# Patient Record
Sex: Female | Born: 1990 | Race: White | Hispanic: No | Marital: Married | Smoking: Never smoker
Health system: Southern US, Community
[De-identification: ages and names within clinical notes are randomized; demographics above are authoritative.]

## PROBLEM LIST (undated history)

## (undated) ENCOUNTER — Inpatient Hospital Stay (HOSPITAL_COMMUNITY): Payer: Self-pay

## (undated) DIAGNOSIS — I498 Other specified cardiac arrhythmias: Secondary | ICD-10-CM

## (undated) DIAGNOSIS — G90A Postural orthostatic tachycardia syndrome (POTS): Secondary | ICD-10-CM

## (undated) DIAGNOSIS — G43909 Migraine, unspecified, not intractable, without status migrainosus: Secondary | ICD-10-CM

## (undated) DIAGNOSIS — D473 Essential (hemorrhagic) thrombocythemia: Secondary | ICD-10-CM

## (undated) HISTORY — DX: Essential (hemorrhagic) thrombocythemia: D47.3

## (undated) HISTORY — PX: OTHER SURGICAL HISTORY: SHX169

## (undated) HISTORY — DX: Postural orthostatic tachycardia syndrome (POTS): G90.A

## (undated) HISTORY — DX: Migraine, unspecified, not intractable, without status migrainosus: G43.909

## (undated) HISTORY — DX: Other specified cardiac arrhythmias: I49.8

---

## 2009-11-04 ENCOUNTER — Other Ambulatory Visit: Admission: RE | Admit: 2009-11-04 | Discharge: 2009-11-04 | Payer: Self-pay | Admitting: Gynecology

## 2009-11-04 ENCOUNTER — Ambulatory Visit: Payer: Self-pay | Admitting: Women's Health

## 2009-12-23 ENCOUNTER — Ambulatory Visit: Payer: Self-pay | Admitting: Women's Health

## 2011-03-29 ENCOUNTER — Encounter: Payer: Self-pay | Admitting: Women's Health

## 2011-03-29 ENCOUNTER — Ambulatory Visit (INDEPENDENT_AMBULATORY_CARE_PROVIDER_SITE_OTHER): Payer: BC Managed Care – PPO | Admitting: Women's Health

## 2011-03-29 ENCOUNTER — Other Ambulatory Visit (HOSPITAL_COMMUNITY)
Admission: RE | Admit: 2011-03-29 | Discharge: 2011-03-29 | Disposition: A | Discharge status: Home / Self Care | Payer: BC Managed Care – PPO | Source: Ambulatory Visit | Attending: Obstetrics and Gynecology | Admitting: Obstetrics and Gynecology

## 2011-03-29 VITALS — BP 110/70 | Ht 63.0 in | Wt 158.0 lb

## 2011-03-29 DIAGNOSIS — Z23 Encounter for immunization: Secondary | ICD-10-CM

## 2011-03-29 DIAGNOSIS — Z01419 Encounter for gynecological examination (general) (routine) without abnormal findings: Secondary | ICD-10-CM

## 2011-03-29 DIAGNOSIS — Z113 Encounter for screening for infections with a predominantly sexual mode of transmission: Secondary | ICD-10-CM

## 2011-03-29 LAB — CBC WITH DIFFERENTIAL/PLATELET
Eosinophils Relative: 1 % (ref 0–5)
HCT: 41.8 % (ref 36.0–46.0)
Hemoglobin: 13.6 g/dL (ref 12.0–15.0)
Lymphocytes Relative: 27 % (ref 12–46)
Lymphs Abs: 2 10*3/uL (ref 0.7–4.0)
MCV: 93.9 fL (ref 78.0–100.0)
Platelets: 485 10*3/uL — ABNORMAL HIGH (ref 150–400)
RBC: 4.45 MIL/uL (ref 3.87–5.11)
WBC: 7.1 10*3/uL (ref 4.0–10.5)

## 2011-03-29 LAB — HIV ANTIBODY (ROUTINE TESTING W REFLEX): HIV: NONREACTIVE

## 2011-03-29 LAB — HEPATITIS C ANTIBODY: HCV Ab: NEGATIVE

## 2011-03-29 NOTE — Patient Instructions (Signed)

## 2011-03-29 NOTE — Progress Notes (Signed)
Leslie Guerrero Feb 09, 1990 [id number]    History:    The patient presents for annual exam.  Monthly 4-5 day cycle on Ortho Tri-Cyclen without complaint. History of one normal Pap smear. Had1 gardasil in 2011, did not complete series. New partner. Requests  nexplanon.   Past medical history, past surgical history, family history and social history were all reviewed and documented in the EPIC chart. 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   ROS:  A  ROS was performed and pertinent positives and negatives are included in the history.  Exam:  Filed Vitals:   03/29/11 0806  BP: 110/70    General appearance:  Normal Head/Neck:  Normal, without cervical or supraclavicular adenopathy. Thyroid:  Symmetrical, normal in size, without palpable masses or nodularity. Respiratory  Effort:  Normal  Auscultation:  Clear without wheezing or rhonchi Cardiovascular  Auscultation:  Regular rate, without rubs, murmurs or gallops  Edema/varicosities:  Not grossly evident Abdominal  Soft,nontender, without masses, guarding or rebound.  Liver/spleen:  No organomegaly noted  Hernia:  None appreciated  Skin  Inspection:  Grossly normal  Palpation:  Grossly normal Neurologic/psychiatric  Orientation:  Normal with appropriate conversation.  Mood/affect:  Normal  Genitourinary    Breasts: Examined lying and sitting.     Right: Without masses, retractions, discharge or axillary adenopathy.     Left: Without masses, retractions, discharge or axillary adenopathy.   Inguinal/mons:  Normal without inguinal adenopathy  External genitalia:  Normal  BUS/Urethra/Skene's glands:  Normal  Bladder:  Normal  Vagina:  Normal  Cervix:  Normal  Uterus:   normal in size, shape and contour.  Midline and mobile  Adnexa/parametria:     Rt: Without masses or tenderness.   Lt: Without masses or tenderness.  Anus and perineum: Normal  Digital rectal exam:   Assessment/Plan:  21 y.o. SWF G0 for annual exam without  complaint.  Normal GYN exam Restart gardasil series STD screen Contraceptive counseling  Plan:Nexplanon information given and reviewed. Reviewed slight risk for spotting. Encouraged condoms until and first month on nexplanon and for infection control. Used depo provera in high school and did well on. Will schedule with her cycle with Dr.Fontaine. SBE's, exercise, calcium rich diet, MVI daily encouraged. CBC, UA, Pap, GC/Chlamydia, HIV, hepatitis C, RPR. Gardasil series restarted, reviewed importance of returning in 2 and 6 months to complete series.    Harrington Challenger [facility], 8:50 AM 03/29/2011

## 2011-03-30 ENCOUNTER — Other Ambulatory Visit: Payer: Self-pay | Admitting: *Deleted

## 2011-03-30 ENCOUNTER — Telehealth: Payer: Self-pay | Admitting: *Deleted

## 2011-03-30 DIAGNOSIS — Z3049 Encounter for surveillance of other contraceptives: Secondary | ICD-10-CM

## 2011-03-30 LAB — URINALYSIS W MICROSCOPIC + REFLEX CULTURE
Hgb urine dipstick: NEGATIVE
Leukocytes, UA: NEGATIVE
Nitrite: NEGATIVE
Protein, ur: NEGATIVE mg/dL
Urobilinogen, UA: 0.2 mg/dL (ref 0.0–1.0)

## 2011-03-30 MED ORDER — ETONOGESTREL 68 MG ~~LOC~~ IMPL: 68.0000 mg | DRUG_IMPLANT | Freq: Once | SUBCUTANEOUS | Status: DC

## 2011-03-30 NOTE — Telephone Encounter (Signed)
Patient informed benefits for Nexplanon are $35 copay. TF to insert on period.  Transferred to appts to set up.

## 2011-03-30 NOTE — Telephone Encounter (Signed)
Message copied by Libby Maw on Thu Mar 30, 2011 10:41 AM ------      Message from: [place], [place] J      Created: Wed Mar 29, 2011  9:30 AM       Freedom Peddy, please check nexplanon benefits. Dr. Audie Box to place.             Also disregard checking On benefits for [employer].

## 2011-04-05 ENCOUNTER — Other Ambulatory Visit: Payer: Self-pay

## 2011-04-05 DIAGNOSIS — R7989 Other specified abnormal findings of blood chemistry: Secondary | ICD-10-CM

## 2011-04-19 ENCOUNTER — Ambulatory Visit (INDEPENDENT_AMBULATORY_CARE_PROVIDER_SITE_OTHER): Payer: BC Managed Care – PPO | Admitting: Gynecology

## 2011-04-19 ENCOUNTER — Encounter: Payer: Self-pay | Admitting: Gynecology

## 2011-04-19 ENCOUNTER — Other Ambulatory Visit: Payer: Self-pay | Admitting: Gynecology

## 2011-04-19 DIAGNOSIS — Z3049 Encounter for surveillance of other contraceptives: Secondary | ICD-10-CM

## 2011-04-19 DIAGNOSIS — Z30017 Encounter for initial prescription of implantable subdermal contraceptive: Secondary | ICD-10-CM

## 2011-04-19 DIAGNOSIS — R52 Pain, unspecified: Secondary | ICD-10-CM

## 2011-04-19 MED ORDER — LIDOCAINE HCL 1 % IJ SOLN: 3.0000 mL | Freq: Once | INTRAMUSCULAR | Status: DC

## 2011-04-19 NOTE — Patient Instructions (Signed)
Take pressure dressing off tomorrow. Report any redness swelling or drainage from the insertion site.  Follow up if any issues or questions. Otherwise follow up routinely.

## 2011-04-19 NOTE — Progress Notes (Signed)
Patient presents for and Nexplanon placement. Normally sees [place] and was counseled for this. Had used Depo-Provera in the past. Had switched over to oral contraceptives but likes the convenience of a long acting contraceptive. She is currently on a normal menses. I reviewed Nexplanon with her to include the insertional process. I reviewed the risks to include infection, bleeding with hemorrhage, neurovascular injury leading to permanent sequela I and the need for future surgery, migration requiring more involved removal surgery and the risk of failure. Side effect profile reviewed to include prolonged or irregular bleeding. She understands and accepts all of this and wants to proceed with placement.  She has read and signed the consent form.  Patient is right-handed.  Procedure with Sherrilyn Rist assistant Left arm was positioned and the insertional route was measured and marked per manufacturer/training guidelines. The skin was cleansed with Betadine and the insertional tract was injected subcutaneously with 1% lidocaine. The Nexplanon was then placed according to manufacturer's guidelines without difficulty. The Nexplanon was clearly palpable under the skin and I had the patient feel it herself. A Steri-Strip was placed over the insertional site and a pressure dressing applied. Postoperative instructions were given to the patient.  Lot #[id number]

## 2011-05-24 DIAGNOSIS — D75839 Thrombocytosis, unspecified: Secondary | ICD-10-CM

## 2011-05-24 HISTORY — DX: Thrombocytosis, unspecified: D75.839

## 2011-06-13 ENCOUNTER — Other Ambulatory Visit: Payer: BC Managed Care – PPO

## 2011-06-13 ENCOUNTER — Ambulatory Visit (INDEPENDENT_AMBULATORY_CARE_PROVIDER_SITE_OTHER): Payer: BC Managed Care – PPO

## 2011-06-13 DIAGNOSIS — Z23 Encounter for immunization: Secondary | ICD-10-CM

## 2011-06-13 LAB — CBC WITH DIFFERENTIAL/PLATELET
Eosinophils Relative: 1 % (ref 0–5)
HCT: 39.3 % (ref 36.0–46.0)
Hemoglobin: 13.4 g/dL (ref 12.0–15.0)
Lymphocytes Relative: 27 % (ref 12–46)
MCHC: 34.1 g/dL (ref 30.0–36.0)
MCV: 89.9 fL (ref 78.0–100.0)
Monocytes Absolute: 0.6 10*3/uL (ref 0.1–1.0)
Monocytes Relative: 9 % (ref 3–12)
Neutro Abs: 4.6 10*3/uL (ref 1.7–7.7)

## 2011-06-20 ENCOUNTER — Encounter: Payer: Self-pay | Admitting: Gynecology

## 2011-06-20 ENCOUNTER — Telehealth: Payer: Self-pay | Admitting: Gynecology

## 2011-06-20 DIAGNOSIS — D473 Essential (hemorrhagic) thrombocythemia: Secondary | ICD-10-CM

## 2011-06-20 NOTE — Telephone Encounter (Signed)
Tell patient that her platelet count is mildly elevated. It has been over the last several checks. I would like her to come in and have several other blood tests just to check other possibilities. I may ultimately want her to see a hematologist as a baseline. I do not think that this is anything of significance but I think it is worth evaluating at least once as I think that she will continue to have a mildly elevated platelet count probably throughout her life. Orders for the blood tests were placed.

## 2011-06-21 ENCOUNTER — Telehealth: Payer: Self-pay | Admitting: Gynecology

## 2011-06-21 NOTE — Telephone Encounter (Signed)
Pt informed with the below note. 

## 2011-06-21 NOTE — Telephone Encounter (Signed)
Erroneous entry

## 2011-06-27 ENCOUNTER — Other Ambulatory Visit: Payer: BC Managed Care – PPO

## 2011-06-27 DIAGNOSIS — D473 Essential (hemorrhagic) thrombocythemia: Secondary | ICD-10-CM

## 2011-06-28 LAB — C-REACTIVE PROTEIN: CRP: 0.28 mg/dL (ref <0.60)

## 2011-06-28 LAB — SEDIMENTATION RATE: Sed Rate: 1 mm/hr (ref 0–22)

## 2011-06-30 ENCOUNTER — Telehealth: Payer: Self-pay | Admitting: Oncology

## 2011-06-30 NOTE — Telephone Encounter (Signed)
called pt to schedule appt and pt stated that she will see what day she can have off and rtn call to scheduled

## 2011-07-04 ENCOUNTER — Telehealth: Payer: Self-pay | Admitting: Oncology

## 2011-07-04 NOTE — Telephone Encounter (Signed)
called pt lmovm to rtn call to schedule appt 

## 2011-07-04 NOTE — Telephone Encounter (Signed)
pt called and scheduled appt for 07/09.  faxed over a letter to Dr. Audie Box with appt d/t

## 2011-07-05 ENCOUNTER — Telehealth: Payer: Self-pay | Admitting: Oncology

## 2011-07-05 NOTE — Telephone Encounter (Signed)
Referred by Dr. Colin Broach Dx- Thrombcytosis

## 2011-08-01 ENCOUNTER — Telehealth: Payer: Self-pay | Admitting: General Practice

## 2011-08-01 ENCOUNTER — Encounter: Payer: Self-pay | Admitting: Oncology

## 2011-08-01 ENCOUNTER — Ambulatory Visit (HOSPITAL_BASED_OUTPATIENT_CLINIC_OR_DEPARTMENT_OTHER): Payer: BC Managed Care – PPO | Admitting: Oncology

## 2011-08-01 ENCOUNTER — Ambulatory Visit: Payer: BC Managed Care – PPO

## 2011-08-01 ENCOUNTER — Ambulatory Visit (HOSPITAL_BASED_OUTPATIENT_CLINIC_OR_DEPARTMENT_OTHER): Payer: No Typology Code available for payment source | Admitting: Lab

## 2011-08-01 VITALS — BP 118/76 | HR 116 | Temp 99.1°F | Ht 63.0 in | Wt 152.9 lb

## 2011-08-01 DIAGNOSIS — M349 Systemic sclerosis, unspecified: Secondary | ICD-10-CM

## 2011-08-01 DIAGNOSIS — D473 Essential (hemorrhagic) thrombocythemia: Secondary | ICD-10-CM

## 2011-08-01 DIAGNOSIS — D72829 Elevated white blood cell count, unspecified: Secondary | ICD-10-CM

## 2011-08-01 LAB — CBC WITH DIFFERENTIAL/PLATELET
BASO%: 0.3 % (ref 0.0–2.0)
EOS%: 0.8 % (ref 0.0–7.0)
LYMPH%: 20 % (ref 14.0–49.7)
MCH: 31.5 pg (ref 25.1–34.0)
MCHC: 33.9 g/dL (ref 31.5–36.0)
MONO#: 0.6 10*3/uL (ref 0.1–0.9)
MONO%: 6.3 % (ref 0.0–14.0)
Platelets: 439 10*3/uL — ABNORMAL HIGH (ref 145–400)
RBC: 4.2 10*6/uL (ref 3.70–5.45)
WBC: 8.9 10*3/uL (ref 3.9–10.3)

## 2011-08-01 LAB — CHCC SMEAR

## 2011-08-01 NOTE — Progress Notes (Signed)
Patient came in today as a new patient and she has one Musician.I did explain to her our financial assistance and co-pay assistance program we offer here,she said that she is [place] for now.I did give her my card just in case she had any questions she can call me.

## 2011-08-01 NOTE — Telephone Encounter (Signed)
appts made and printed for pt aom °

## 2011-08-01 NOTE — Progress Notes (Signed)
Leslie Guerrero New Patient Consult   Referring MD: Leslie Guerrero 21 y.o.  1990-06-04    Reason for Referral: Thrombocytosis     HPI: She reports seeing Dr. Audie Box for a routine exam in March of this year. A CBC was remarkable for a platelet count of 485,000. The hemoglobin returned at 13.6 with an MCV of 93.9. The white count returned at 7.1 with an absolute neutrophil count of 4.5. Additional laboratory studies included a negative hepatitis C antibody, negative HIV, and a negative urinalysis.  She underwent placement of an implanted birth control device on 04/19/2011. A repeat CBC on may 21st 2013 found the platelets of 531,000. On 06/27/2011 the ferritin returned at 46 with a C. reactive protein of 0.28.   She reports no previous history of thrombocytosis. She feels well.  Past Medical History  Diagnosis Date  . Thrombocytosis 05/2011    platelet count 531,000   .    G0 P0  .     Placement of a Nexplanon on 04/19/2011.  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 Breast cancer Paternal great Grandmother   . Diabetes Paternal Uncle   . Diabetes Paternal Grandfather   . Stroke Paternal Grandfather     No current outpatient prescriptions on file. Current facility-administered medications:etonogestrel (IMPLANON) implant 68 mg, 68 mg, Subcutaneous, Once, Leslie P Fontaine, MD;  lidocaine (XYLOCAINE) 1 % (with pres) injection 3 mL, 3 mL, Intradermal, Once, Leslie Lords, MD  Allergies:  Allergies  Allergen Reactions  . Nickel     Social History: She lives alone in [place]. 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 She does not use tobacco or alcohol. No transfusion history. She denies risk factors for HIV or hepatitis. No unusual exposures to chemicals or radiation.   ROS:   Positives include:  "Spotting "after placement of the birth control implant in March of 2013, no regular menstrual cycle, thickening of the skin at the right hand with a decreased size in the right hand/fingers compared to the left side  A complete ROS was otherwise negative.  Physical Exam:  Blood pressure 118/76, pulse 116, temperature 99.1 F (37.3 C), temperature source Oral, height 5\' 3"  (1.6 m), weight 152 lb 14.4 oz (69.355 kg).  HEENT: No thrush or ulcers, the tonsils appear symmetrically enlarged, oropharynx without mass, neck without mass Lungs: Clear bilaterally Cardiac: Regular rate and rhythm Abdomen: No hepatosplenomegaly, nontender  Vascular: No leg edema Lymph nodes: "Shotty "bilateral axillary nodes, no cervical, supraclavicular, or inguinal nodes Neurologic: Alert and oriented, the motor exam appears intact in the upper and lower Jevity, deep tendon reflexes are 2+ and symmetric at the arms and knees Skin: Multiple tattoos, slight hyperpigmentation of the right compared to the left hand Musculoskeletal: Joint without erythema or edema, the right hand and fingers appear underdeveloped compared to the left side   LAB:  CBC  Lab Results  Component Value Date   WBC 8.9 08/01/2011   HGB 13.2 08/01/2011   HCT 39.0 08/01/2011   MCV 92.9 08/01/2011   PLT 439* 08/01/2011    Blood smear: The platelets appear increased in number. There are a few large platelets. The majority of the white cells are mature neutrophils and lymphocytes. A few "atypical "lymphocytes with folded nuclei, no blasts or other young  forms are seen. A few ovalocytes and teardrops. The polychromasia is not increased.  Assessment/Plan:   1. Thrombocytosis 2. Status post placement of an implanted birth control device in March of 2013 3. Diagnosis of "scleroderma "involving the right hand as a child with under development of the right hand/fingers  4. a few atypical lymphocytes seen on review the peripheral blood smear 08/01/2011     Disposition:   She is referred for evaluation of thrombocytosis. There is mild thrombocytosis today. She does not appear to have iron deficiency. No clinical evidence of a systemic inflammatory condition to account for the thrombocytosis. A C. reactive protein was normal and HIV/hepatitis C serologies were negative. Dr. Reynold Bowen office.  She could have an underlying myeloproliferative disorder or the thrombocytosis may be related to a benign normal variant.  We check an LDH and JAK-2 mutation today.  She will return for an office visit and CBC in 3 months. We will proceed with additional diagnostic evaluation to include imaging of the spleen and a bone marrow biopsy if she develops progressive thrombocytosis or new hematologic abnormalities.  Leslie Guerrero 08/01/2011, 6:09 PM

## 2011-08-10 ENCOUNTER — Telehealth: Payer: Self-pay | Admitting: *Deleted

## 2011-08-10 NOTE — Telephone Encounter (Signed)
Message copied by Caleb Popp on Thu Aug 10, 2011 12:18 PM ------      Message from: Thornton Papas B      Created: Wed Aug 09, 2011  8:27 PM       Please call patient, labs negative for iron deficiency or myeloproliferative disorder, f/u as scheduled

## 2011-08-10 NOTE — Telephone Encounter (Signed)
Left message on voicemail for pt to call office for lab results. 

## 2011-10-25 ENCOUNTER — Telehealth: Payer: Self-pay | Admitting: Oncology

## 2011-10-25 NOTE — Telephone Encounter (Signed)
Returned pt's call re cancelling 10/9 appt. Per pt she will not be rescheduling due to she is moving out of town. Desk nurse aware.

## 2011-11-01 ENCOUNTER — Ambulatory Visit: Payer: No Typology Code available for payment source | Admitting: Nurse Practitioner

## 2011-11-01 ENCOUNTER — Other Ambulatory Visit: Payer: No Typology Code available for payment source | Admitting: Lab

## 2014-02-11 ENCOUNTER — Ambulatory Visit (INDEPENDENT_AMBULATORY_CARE_PROVIDER_SITE_OTHER): Payer: BC Managed Care – PPO | Admitting: Women's Health

## 2014-02-11 ENCOUNTER — Encounter: Payer: Self-pay | Admitting: Women's Health

## 2014-02-11 VITALS — BP 126/80 | Ht 63.0 in | Wt 178.0 lb

## 2014-02-11 DIAGNOSIS — Z30011 Encounter for initial prescription of contraceptive pills: Secondary | ICD-10-CM

## 2014-02-11 DIAGNOSIS — Z01419 Encounter for gynecological examination (general) (routine) without abnormal findings: Secondary | ICD-10-CM

## 2014-02-11 MED ORDER — NORETHIN ACE-ETH ESTRAD-FE 1-20 MG-MCG PO TABS: 1.0000 | ORAL_TABLET | Freq: Every day | ORAL | Status: DC

## 2014-02-11 NOTE — Progress Notes (Signed)
Leslie Guerrero [birth year] [id number]    History:    Presents for annual exam with 2 complaints, questions about contraception and periodic enlargement of the neck/below chin. Previously on Implanon, taken out by another office in 10/2013 due to dysmenorrhea and menses/spotting for 3 weeks out of the month for 3 continuous months. Other office gave trial of Minastrin, with relief of dysmenorrhea, shorter flow. Normal pap history. Gardasil completed. Same partner. Sporadic enlargement of sublingual gland area, slight tenderness. Denies fever, dry mouth. No temporal association.  Past medical history, past surgical history, family history and social history were all reviewed and documented in the EPIC chart. New Engineer, manufacturing systems. Breast cancer PGM. Diabetes PGF and paternal uncle.   ROS:  A ROS was performed and pertinent positives and negatives are included.  Exam:  Filed Vitals:   02/11/14 1411  BP: 126/80    General appearance:  Normal Thyroid:  Symmetrical, normal in size, without palpable masses or nodularity. Neck, nonpalpable/nontender lymph node sublingual gland below chin Respiratory  Auscultation:  Clear without wheezing or rhonchi Cardiovascular  Auscultation:  Regular rate, without rubs, murmurs or gallops  Edema/varicosities:  Not grossly evident Abdominal  Soft,nontender, without masses, guarding or rebound.  Liver/spleen:  No organomegaly noted  Hernia:  None appreciated  Skin  Inspection:  Grossly normal    Breasts: Examined lying and sitting.     Right: Without masses, retractions, discharge or axillary adenopathy.     Left: Without masses, retractions, discharge or axillary adenopathy. Gentitourinary   Inguinal/mons:  Normal without inguinal adenopathy  External genitalia:  Normal  BUS/Urethra/Skene's glands:  Normal  Vagina:  Normal  Cervix:  Normal  Uterus:  Normal in size, shape and contour.  Midline and mobile  Adnexa/parametria:     Rt: Without masses or  tenderness.   Lt: Without masses or tenderness.  Anus and perineum: Normal    Assessment/Plan:  24 y.o.  SWF G0 for annual exam with complaints of contraception education and periodic edematous neck    Normal GYN exam  Contraception Management Salivary gland blockage vs lymph node enlargement  Plan: Contraception options discussed, Loestrin Fe 1-20 prescribed,  risk of blood clots and stroke reviewed. SBE's, increase exercise, decrease calories for weight loss, calcium rich diet, MVI encouraged. Last normal pap 2013, new pap screening guidelines reviewed. Keep a journal of symptoms, precipitating events, temporal associations. If symptoms persist or worsen follow up with primary care. CBC, UA.     Huel Cote WHNP, 2:55 PM 02/11/2014

## 2014-02-11 NOTE — Patient Instructions (Signed)
Oral Contraception Use Oral contraceptive pills (OCPs) are medicines taken to prevent pregnancy. OCPs work by preventing the ovaries from releasing eggs. The hormones in OCPs also cause the cervical mucus to thicken, preventing the sperm from entering the uterus. The hormones also cause the uterine lining to become thin, not allowing a fertilized egg to attach to the inside of the uterus. OCPs are highly effective when taken exactly as prescribed. However, OCPs do not prevent sexually transmitted diseases (STDs). Safe sex practices, such as using condoms along with an OCP, can help prevent STDs. Before taking OCPs, you may have a physical exam and Pap test. Your health care provider may also order blood tests if necessary. Your health care provider will make sure you are a good candidate for oral contraception. Discuss with your health care provider the possible side effects of the OCP you may be prescribed. When starting an OCP, it can take 2 to 3 months for the body to adjust to the changes in hormone levels in your body.  HOW TO TAKE ORAL CONTRACEPTIVE PILLS Your health care provider may advise you on how to start taking the first cycle of OCPs. Otherwise, you can:   Start on day 1 of your menstrual period. You will not need any backup contraceptive protection with this start time.   Start on the first Sunday after your menstrual period or the day you get your prescription. In these cases, you will need to use backup contraceptive protection for the first week.   Start the pill at any time of your cycle. If you take the pill within 5 days of the start of your period, you are protected against pregnancy right away. In this case, you will not need a backup form of birth control. If you start at any other time of your menstrual cycle, you will need to use another form of birth control for 7 days. If your OCP is the type called a minipill, it will protect you from pregnancy after taking it for 2 days (48  hours). After you have started taking OCPs:   If you forget to take 1 pill, take it as soon as you remember. Take the next pill at the regular time.   If you miss 2 or more pills, call your health care provider because different pills have different instructions for missed doses. Use backup birth control until your next menstrual period starts.   If you use a 28-day pack that contains inactive pills and you miss 1 of the last 7 pills (pills with no hormones), it will not matter. Throw away the rest of the non-hormone pills and start a new pill pack.  No matter which day you start the OCP, you will always start a new pack on that same day of the week. Have an extra pack of OCPs and a backup contraceptive method available in case you miss some pills or lose your OCP pack.  HOME CARE INSTRUCTIONS   Do not smoke.   Always use a condom to protect against STDs. OCPs do not protect against STDs.   Use a calendar to mark your menstrual period days.   Read the information and directions that came with your OCP. Talk to your health care provider if you have questions.  SEEK MEDICAL CARE IF:   You develop nausea and vomiting.   You have abnormal vaginal discharge or bleeding.   You develop a rash.   You miss your menstrual period.   You are losing   your hair.   You need treatment for mood swings or depression.   You get dizzy when taking the OCP.   You develop acne from taking the OCP.   You become pregnant.  SEEK IMMEDIATE MEDICAL CARE IF:   You develop chest pain.   You develop shortness of breath.   You have an uncontrolled or severe headache.   You develop numbness or slurred speech.   You develop visual problems.   You develop pain, redness, and swelling in the legs.  Document Released: 12/29/2010 Document Revised: 05/26/2013 Document Reviewed: 06/30/2012 [employer] Patient Information 2015 [employer]. This information is not intended to replace  advice given to you by your health care provider. Make sure you discuss any questions you have with your health care provider. Health Maintenance Adopting a healthy lifestyle and getting preventive care can go a long way to promote health and wellness. Talk with your health care provider about what schedule of regular examinations is right for you. This is a good chance for you to check in with your provider about disease prevention and staying healthy. In between checkups, there are plenty of things you can do on your own. Experts have done a lot of research about which lifestyle changes and preventive measures are most likely to keep you healthy. Ask your health care provider for more information. WEIGHT AND DIET  Eat a healthy diet  Be sure to include plenty of vegetables, fruits, low-fat dairy products, and lean protein.  Do not eat a lot of foods high in solid fats, added sugars, or salt.  Get regular exercise. This is one of the most important things you can do for your health.  Most adults should exercise for at least 150 minutes each week. The exercise should increase your heart rate and make you sweat (moderate-intensity exercise).  Most adults should also do strengthening exercises at least twice a week. This is in addition to the moderate-intensity exercise.  Maintain a healthy weight  Body mass index (BMI) is a measurement that can be used to identify possible weight problems. It estimates body fat based on height and weight. Your health care provider can help determine your BMI and help you achieve or maintain a healthy weight.  For females 20 years of age and older:   A BMI below 18.5 is considered underweight.  A BMI of 18.5 to 24.9 is normal.  A BMI of 25 to 29.9 is considered overweight.  A BMI of 30 and above is considered obese.  Watch levels of cholesterol and blood lipids  You should start having your blood tested for lipids and cholesterol at 26 years of age,  then have this test every 5 years.  You may need to have your cholesterol levels checked more often if:  Your lipid or cholesterol levels are high.  You are older than 26 years of age.  You are at high risk for heart disease.  CANCER SCREENING   Lung Cancer  Lung cancer screening is recommended for adults 55-80 years old who are at high risk for lung cancer because of a history of smoking.  A yearly low-dose CT scan of the lungs is recommended for people who:  Currently smoke.  Have quit within the past 15 years.  Have at least a 30-pack-year history of smoking. A pack year is smoking an average of one pack of cigarettes a day for 1 year.  Yearly screening should continue until it has been 15 years since you quit.    Yearly screening should stop if you develop a health problem that would prevent you from having lung cancer treatment.  Breast Cancer  Practice breast self-awareness. This means understanding how your breasts normally appear and feel.  It also means doing regular breast self-exams. Let your health care provider know about any changes, no matter how small.  If you are in your 20s or 30s, you should have a clinical breast exam (CBE) by a health care provider every 1-3 years as part of a regular health exam.  If you are 25 or older, have a CBE every year. Also consider having a breast X-ray (mammogram) every year.  If you have a family history of breast cancer, talk to your health care provider about genetic screening.  If you are at high risk for breast cancer, talk to your health care provider about having an MRI and a mammogram every year.  Breast cancer gene (BRCA) assessment is recommended for women who have family members with BRCA-related cancers. BRCA-related cancers include:  Breast.  Ovarian.  Tubal.  Peritoneal cancers.  Results of the assessment will determine the need for genetic counseling and BRCA1 and BRCA2 testing. Cervical  Cancer Routine pelvic examinations to screen for cervical cancer are no longer recommended for nonpregnant women who are considered low risk for cancer of the pelvic organs (ovaries, uterus, and vagina) and who do not have symptoms. A pelvic examination may be necessary if you have symptoms including those associated with pelvic infections. Ask your health care provider if a screening pelvic exam is right for you.   The Pap test is the screening test for cervical cancer for women who are considered at risk.  If you had a hysterectomy for a problem that was not cancer or a condition that could lead to cancer, then you no longer need Pap tests.  If you are older than 65 years, and you have had normal Pap tests for the past 10 years, you no longer need to have Pap tests.  If you have had past treatment for cervical cancer or a condition that could lead to cancer, you need Pap tests and screening for cancer for at least 20 years after your treatment.  If you no longer get a Pap test, assess your risk factors if they change (such as having a new sexual partner). This can affect whether you should start being screened again.  Some women have medical problems that increase their chance of getting cervical cancer. If this is the case for you, your health care provider may recommend more frequent screening and Pap tests.  The human papillomavirus (HPV) test is another test that may be used for cervical cancer screening. The HPV test looks for the virus that can cause cell changes in the cervix. The cells collected during the Pap test can be tested for HPV.  The HPV test can be used to screen women 24 years of age and older. Getting tested for HPV can extend the interval between normal Pap tests from three to five years.  An HPV test also should be used to screen women of any age who have unclear Pap test results.  After 26 years of age, women should have HPV testing as often as Pap tests.  Colorectal  Cancer  This type of cancer can be detected and often prevented.  Routine colorectal cancer screening usually begins at 26 years of age and continues through 26 years of age.  Your health care provider may recommend screening at  an earlier age if you have risk factors for colon cancer.  Your health care provider may also recommend using home test kits to check for hidden blood in the stool.  A small camera at the end of a tube can be used to examine your colon directly (sigmoidoscopy or colonoscopy). This is done to check for the earliest forms of colorectal cancer.  Routine screening usually begins at age 46.  Direct examination of the colon should be repeated every 5-10 years through 26 years of age. However, you may need to be screened more often if early forms of precancerous polyps or small growths are found. Skin Cancer  Check your skin from head to toe regularly.  Tell your health care provider about any new moles or changes in moles, especially if there is a change in a mole's shape or color.  Also tell your health care provider if you have a mole that is larger than the size of a pencil eraser.  Always use sunscreen. Apply sunscreen liberally and repeatedly throughout the day.  Protect yourself by wearing long sleeves, pants, a wide-brimmed hat, and sunglasses whenever you are outside. HEART DISEASE, DIABETES, AND HIGH BLOOD PRESSURE   Have your blood pressure checked at least every 1-2 years. High blood pressure causes heart disease and increases the risk of stroke.  If you are between 82 years and 64 years old, ask your health care provider if you should take aspirin to prevent strokes.  Have regular diabetes screenings. This involves taking a blood sample to check your fasting blood sugar level.  If you are at a normal weight and have a low risk for diabetes, have this test once every three years after 26 years of age.  If you are overweight and have a high risk for  diabetes, consider being tested at a younger age or more often. PREVENTING INFECTION  Hepatitis B  If you have a higher risk for hepatitis B, you should be screened for this virus. You are considered at high risk for hepatitis B if:  You were born in a country where hepatitis B is common. Ask your health care provider which countries are considered high risk.  Your parents were born in a high-risk country, and you have not been immunized against hepatitis B (hepatitis B vaccine).  You have HIV or AIDS.  You use needles to inject street drugs.  You live with someone who has hepatitis B.  You have had sex with someone who has hepatitis B.  You get hemodialysis treatment.  You take certain medicines for conditions, including cancer, organ transplantation, and autoimmune conditions. Hepatitis C  Blood testing is recommended for:  Everyone born from [birth year] through 1967.  Anyone with known risk factors for hepatitis C. Sexually transmitted infections (STIs)  You should be screened for sexually transmitted infections (STIs) including gonorrhea and chlamydia if:  You are sexually active and are younger than 26 years of age.  You are older than 26 years of age and your health care provider tells you that you are at risk for this type of infection.  Your sexual activity has changed since you were last screened and you are at an increased risk for chlamydia or gonorrhea. Ask your health care provider if you are at risk.  If you do not have HIV, but are at risk, it may be recommended that you take a prescription medicine daily to prevent HIV infection. This is called pre-exposure prophylaxis (PrEP). You are considered at risk  if:  You are sexually active and do not regularly use condoms or know the HIV status of your partner(s).  You take drugs by injection.  You are sexually active with a partner who has HIV. Talk with your health care provider about whether you are at high risk of  being infected with HIV. If you choose to begin PrEP, you should first be tested for HIV. You should then be tested every 3 months for as long as you are taking PrEP.  PREGNANCY   If you are premenopausal and you may become pregnant, ask your health care provider about preconception counseling.  If you may become pregnant, take 400 to 800 micrograms (mcg) of folic acid every day.  If you want to prevent pregnancy, talk to your health care provider about birth control (contraception). OSTEOPOROSIS AND MENOPAUSE   Osteoporosis is a disease in which the bones lose minerals and strength with aging. This can result in serious bone fractures. Your risk for osteoporosis can be identified using a bone density scan.  If you are 65 years of age or older, or if you are at risk for osteoporosis and fractures, ask your health care provider if you should be screened.  Ask your health care provider whether you should take a calcium or vitamin D supplement to lower your risk for osteoporosis.  Menopause may have certain physical symptoms and risks.  Hormone replacement therapy may reduce some of these symptoms and risks. Talk to your health care provider about whether hormone replacement therapy is right for you.  HOME CARE INSTRUCTIONS   Schedule regular health, dental, and eye exams.  Stay current with your immunizations.   Do not use any tobacco products including cigarettes, chewing tobacco, or electronic cigarettes.  If you are pregnant, do not drink alcohol.  If you are breastfeeding, limit how much and how often you drink alcohol.  Limit alcohol intake to no more than 1 drink per day for nonpregnant women. One drink equals 12 ounces of beer, 5 ounces of wine, or 1 ounces of hard liquor.  Do not use street drugs.  Do not share needles.  Ask your health care provider for help if you need support or information about quitting drugs.  Tell your health care provider if you often feel  depressed.  Tell your health care provider if you have ever been abused or do not feel safe at home. Document Released: 07/25/2010 Document Revised: 05/26/2013 Document Reviewed: 12/11/2012 [employer] Patient Information 2015 [employer]. This information is not intended to replace advice given to you by your health care provider. Make sure you discuss any questions you have with your health care provider.  

## 2014-02-12 LAB — CBC WITH DIFFERENTIAL/PLATELET
Basophils Absolute: 0 10*3/uL (ref 0.0–0.1)
Basophils Relative: 0 % (ref 0–1)
EOS PCT: 1 % (ref 0–5)
Eosinophils Absolute: 0.1 10*3/uL (ref 0.0–0.7)
HEMATOCRIT: 39.1 % (ref 36.0–46.0)
HEMOGLOBIN: 12.9 g/dL (ref 12.0–15.0)
LYMPHS ABS: 2.2 10*3/uL (ref 0.7–4.0)
LYMPHS PCT: 22 % (ref 12–46)
MCH: 30.4 pg (ref 26.0–34.0)
MCHC: 33 g/dL (ref 30.0–36.0)
MCV: 92.2 fL (ref 78.0–100.0)
MONO ABS: 0.8 10*3/uL (ref 0.1–1.0)
MONOS PCT: 8 % (ref 3–12)
MPV: 8.7 fL (ref 8.6–12.4)
Neutro Abs: 6.8 10*3/uL (ref 1.7–7.7)
Neutrophils Relative %: 69 % (ref 43–77)
Platelets: 455 10*3/uL — ABNORMAL HIGH (ref 150–400)
RBC: 4.24 MIL/uL (ref 3.87–5.11)
RDW: 13.2 % (ref 11.5–15.5)
WBC: 9.9 10*3/uL (ref 4.0–10.5)

## 2014-02-12 LAB — URINALYSIS W MICROSCOPIC + REFLEX CULTURE
BILIRUBIN URINE: NEGATIVE
CASTS: NONE SEEN
CRYSTALS: NONE SEEN
GLUCOSE, UA: NEGATIVE mg/dL
Hgb urine dipstick: NEGATIVE
Leukocytes, UA: NEGATIVE
Nitrite: NEGATIVE
PH: 5.5 (ref 5.0–8.0)
PROTEIN: NEGATIVE mg/dL
Specific Gravity, Urine: 1.014 (ref 1.005–1.030)
Urobilinogen, UA: 0.2 mg/dL (ref 0.0–1.0)

## 2015-04-06 ENCOUNTER — Other Ambulatory Visit: Payer: Self-pay | Admitting: Women's Health

## 2015-04-20 ENCOUNTER — Ambulatory Visit (INDEPENDENT_AMBULATORY_CARE_PROVIDER_SITE_OTHER): Payer: BC Managed Care – PPO | Admitting: Women's Health

## 2015-04-20 ENCOUNTER — Encounter: Payer: Self-pay | Admitting: Women's Health

## 2015-04-20 VITALS — BP 124/80 | Ht 63.0 in | Wt 178.0 lb

## 2015-04-20 DIAGNOSIS — Z30011 Encounter for initial prescription of contraceptive pills: Secondary | ICD-10-CM | POA: Diagnosis not present

## 2015-04-20 DIAGNOSIS — Z01419 Encounter for gynecological examination (general) (routine) without abnormal findings: Secondary | ICD-10-CM

## 2015-04-20 MED ORDER — NORETHIN ACE-ETH ESTRAD-FE 1-20 MG-MCG PO TABS: 1.0000 | ORAL_TABLET | Freq: Every day | ORAL | Status: DC

## 2015-04-20 NOTE — Patient Instructions (Signed)
Health Maintenance, Female Adopting a healthy lifestyle and getting preventive care can go a long way to promote health and wellness. Talk with your health care provider about what schedule of regular examinations is right for you. This is a good chance for you to check in with your provider about disease prevention and staying healthy. In between checkups, there are plenty of things you can do on your own. Experts have done a lot of research about which lifestyle changes and preventive measures are most likely to keep you healthy. Ask your health care provider for more information. WEIGHT AND DIET  Eat a healthy diet  Be sure to include plenty of vegetables, fruits, low-fat dairy products, and lean protein.  Do not eat a lot of foods high in solid fats, added sugars, or salt.  Get regular exercise. This is one of the most important things you can do for your health.  Most adults should exercise for at least 150 minutes each week. The exercise should increase your heart rate and make you sweat (moderate-intensity exercise).  Most adults should also do strengthening exercises at least twice a week. This is in addition to the moderate-intensity exercise.  Maintain a healthy weight  Body mass index (BMI) is a measurement that can be used to identify possible weight problems. It estimates body fat based on height and weight. Your health care provider can help determine your BMI and help you achieve or maintain a healthy weight.  For females 20 years of age and older:   A BMI below 18.5 is considered underweight.  A BMI of 18.5 to 24.9 is normal.  A BMI of 25 to 29.9 is considered overweight.  A BMI of 30 and above is considered obese.  Watch levels of cholesterol and blood lipids  You should start having your blood tested for lipids and cholesterol at 27 years of age, then have this test every 5 years.  You may need to have your cholesterol levels checked more often if:  Your lipid  or cholesterol levels are high.  You are older than 27 years of age.  You are at high risk for heart disease.  CANCER SCREENING   Lung Cancer  Lung cancer screening is recommended for adults 55-80 years old who are at high risk for lung cancer because of a history of smoking.  A yearly low-dose CT scan of the lungs is recommended for people who:  Currently smoke.  Have quit within the past 15 years.  Have at least a 30-pack-year history of smoking. A pack year is smoking an average of one pack of cigarettes a day for 1 year.  Yearly screening should continue until it has been 15 years since you quit.  Yearly screening should stop if you develop a health problem that would prevent you from having lung cancer treatment.  Breast Cancer  Practice breast self-awareness. This means understanding how your breasts normally appear and feel.  It also means doing regular breast self-exams. Let your health care provider know about any changes, no matter how small.  If you are in your 20s or 30s, you should have a clinical breast exam (CBE) by a health care provider every 1-3 years as part of a regular health exam.  If you are 40 or older, have a CBE every year. Also consider having a breast X-ray (mammogram) every year.  If you have a family history of breast cancer, talk to your health care provider about genetic screening.  If you   are at high risk for breast cancer, talk to your health care provider about having an MRI and a mammogram every year.  Breast cancer gene (BRCA) assessment is recommended for women who have family members with BRCA-related cancers. BRCA-related cancers include:  Breast.  Ovarian.  Tubal.  Peritoneal cancers.  Results of the assessment will determine the need for genetic counseling and BRCA1 and BRCA2 testing. Cervical Cancer Your health care provider may recommend that you be screened regularly for cancer of the pelvic organs (ovaries, uterus, and  vagina). This screening involves a pelvic examination, including checking for microscopic changes to the surface of your cervix (Pap test). You may be encouraged to have this screening done every 3 years, beginning at age 21.  For women ages 30-65, health care providers may recommend pelvic exams and Pap testing every 3 years, or they may recommend the Pap and pelvic exam, combined with testing for human papilloma virus (HPV), every 5 years. Some types of HPV increase your risk of cervical cancer. Testing for HPV may also be done on women of any age with unclear Pap test results.  Other health care providers may not recommend any screening for nonpregnant women who are considered low risk for pelvic cancer and who do not have symptoms. Ask your health care provider if a screening pelvic exam is right for you.  If you have had past treatment for cervical cancer or a condition that could lead to cancer, you need Pap tests and screening for cancer for at least 20 years after your treatment. If Pap tests have been discontinued, your risk factors (such as having a new sexual partner) need to be reassessed to determine if screening should resume. Some women have medical problems that increase the chance of getting cervical cancer. In these cases, your health care provider may recommend more frequent screening and Pap tests. Colorectal Cancer  This type of cancer can be detected and often prevented.  Routine colorectal cancer screening usually begins at 27 years of age and continues through 27 years of age.  Your health care provider may recommend screening at an earlier age if you have risk factors for colon cancer.  Your health care provider may also recommend using home test kits to check for hidden blood in the stool.  A small camera at the end of a tube can be used to examine your colon directly (sigmoidoscopy or colonoscopy). This is done to check for the earliest forms of colorectal  cancer.  Routine screening usually begins at age 50.  Direct examination of the colon should be repeated every 5-10 years through 27 years of age. However, you may need to be screened more often if early forms of precancerous polyps or small growths are found. Skin Cancer  Check your skin from head to toe regularly.  Tell your health care provider about any new moles or changes in moles, especially if there is a change in a mole's shape or color.  Also tell your health care provider if you have a mole that is larger than the size of a pencil eraser.  Always use sunscreen. Apply sunscreen liberally and repeatedly throughout the day.  Protect yourself by wearing long sleeves, pants, a wide-brimmed hat, and sunglasses whenever you are outside. HEART DISEASE, DIABETES, AND HIGH BLOOD PRESSURE   High blood pressure causes heart disease and increases the risk of stroke. High blood pressure is more likely to develop in:  People who have blood pressure in the high end   of the normal range (130-139/85-89 mm Hg).  People who are overweight or obese.  People who are African American.  If you are 38-23 years of age, have your blood pressure checked every 3-5 years. If you are 61 years of age or older, have your blood pressure checked every year. You should have your blood pressure measured twice--once when you are at a hospital or clinic, and once when you are not at a hospital or clinic. Record the average of the two measurements. To check your blood pressure when you are not at a hospital or clinic, you can use:  An automated blood pressure machine at a pharmacy.  A home blood pressure monitor.  If you are between 45 years and 39 years old, ask your health care provider if you should take aspirin to prevent strokes.  Have regular diabetes screenings. This involves taking a blood sample to check your fasting blood sugar level.  If you are at a normal weight and have a low risk for diabetes,  have this test once every three years after 27 years of age.  If you are overweight and have a high risk for diabetes, consider being tested at a younger age or more often. PREVENTING INFECTION  Hepatitis B  If you have a higher risk for hepatitis B, you should be screened for this virus. You are considered at high risk for hepatitis B if:  You were born in a country where hepatitis B is common. Ask your health care provider which countries are considered high risk.  Your parents were born in a high-risk country, and you have not been immunized against hepatitis B (hepatitis B vaccine).  You have HIV or AIDS.  You use needles to inject street drugs.  You live with someone who has hepatitis B.  You have had sex with someone who has hepatitis B.  You get hemodialysis treatment.  You take certain medicines for conditions, including cancer, organ transplantation, and autoimmune conditions. Hepatitis C  Blood testing is recommended for:  Everyone born from [birth year] through 1967.  Anyone with known risk factors for hepatitis C. Sexually transmitted infections (STIs)  You should be screened for sexually transmitted infections (STIs) including gonorrhea and chlamydia if:  You are sexually active and are younger than 27 years of age.  You are older than 27 years of age and your health care provider tells you that you are at risk for this type of infection.  Your sexual activity has changed since you were last screened and you are at an increased risk for chlamydia or gonorrhea. Ask your health care provider if you are at risk.  If you do not have HIV, but are at risk, it may be recommended that you take a prescription medicine daily to prevent HIV infection. This is called pre-exposure prophylaxis (PrEP). You are considered at risk if:  You are sexually active and do not regularly use condoms or know the HIV status of your partner(s).  You take drugs by injection.  You are sexually  active with a partner who has HIV. Talk with your health care provider about whether you are at high risk of being infected with HIV. If you choose to begin PrEP, you should first be tested for HIV. You should then be tested every 3 months for as long as you are taking PrEP.  PREGNANCY   If you are premenopausal and you may become pregnant, ask your health care provider about preconception counseling.  If you may  become pregnant, take 400 to 800 micrograms (mcg) of folic acid every day.  If you want to prevent pregnancy, talk to your health care provider about birth control (contraception). OSTEOPOROSIS AND MENOPAUSE   Osteoporosis is a disease in which the bones lose minerals and strength with aging. This can result in serious bone fractures. Your risk for osteoporosis can be identified using a bone density scan.  If you are 61 years of age or older, or if you are at risk for osteoporosis and fractures, ask your health care provider if you should be screened.  Ask your health care provider whether you should take a calcium or vitamin D supplement to lower your risk for osteoporosis.  Menopause may have certain physical symptoms and risks.  Hormone replacement therapy may reduce some of these symptoms and risks. Talk to your health care provider about whether hormone replacement therapy is right for you.  HOME CARE INSTRUCTIONS   Schedule regular health, dental, and eye exams.  Stay current with your immunizations.   Do not use any tobacco products including cigarettes, chewing tobacco, or electronic cigarettes.  If you are pregnant, do not drink alcohol.  If you are breastfeeding, limit how much and how often you drink alcohol.  Limit alcohol intake to no more than 1 drink per day for nonpregnant women. One drink equals 12 ounces of beer, 5 ounces of wine, or 1 ounces of hard liquor.  Do not use street drugs.  Do not share needles.  Ask your health care provider for help if  you need support or information about quitting drugs.  Tell your health care provider if you often feel depressed.  Tell your health care provider if you have ever been abused or do not feel safe at home.   This information is not intended to replace advice given to you by your health care provider. Make sure you discuss any questions you have with your health care provider.   Document Released: 07/25/2010 Document Revised: 01/30/2014 Document Reviewed: 12/11/2012 Elsevier Interactive Patient Education [employer].

## 2015-04-20 NOTE — Progress Notes (Signed)
Leslie Guerrero November 14, 1990 [id number]    History:    Presents for annual exam.  Monthly cycle on Loestrin without complaint. Gardasil series completed. Same partner, denies need for STD screen. History of migraines doing much better neurologist placed on atenolol. Normal Pap history.  Past medical history, past surgical history, family history and social history were all reviewed and documented in the EPIC chart. 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  ROS:  A ROS was performed and pertinent positives and negatives are included.  Exam:  Filed Vitals:   04/20/15 1358  BP: 124/80    General appearance:  Normal Thyroid:  Symmetrical, normal in size, without palpable masses or nodularity. Respiratory  Auscultation:  Clear without wheezing or rhonchi Cardiovascular  Auscultation:  Regular rate, without rubs, murmurs or gallops  Edema/varicosities:  Not grossly evident Abdominal  Soft,nontender, without masses, guarding or rebound.  Liver/spleen:  No organomegaly noted  Hernia:  None appreciated  Skin  Inspection:  Grossly normal   Breasts: Examined lying and sitting.     Right: Without masses, retractions, discharge or axillary adenopathy.     Left: Without masses, retractions, discharge or axillary adenopathy. Gentitourinary   Inguinal/mons:  Normal without inguinal adenopathy  External genitalia:  Normal  BUS/Urethra/Skene's glands:  Normal  Vagina:  Normal  Cervix:  Normal  Uterus:  normal in size, shape and contour.  Midline and mobile  Adnexa/parametria:     Rt: Without masses or tenderness.   Lt: Without masses or tenderness.  Anus and perineum: Normal   Assessment/Plan:  25 y.o. S WF G0 for annual exam with no complaints.  Monthly cycle on Loestrin Labs at primary care  Plan: Loestrin prescription, proper use, slight risk for blood clots and strokes reviewed. SBE's, exercise, calcium rich diet, MVI daily encouraged. UA, Pap.  Huel Cote [facility],  2:48 PM 04/20/2015

## 2015-04-21 LAB — URINALYSIS W MICROSCOPIC + REFLEX CULTURE
BACTERIA UA: NONE SEEN [HPF]
Bilirubin Urine: NEGATIVE
CASTS: NONE SEEN [LPF]
CRYSTALS: NONE SEEN [HPF]
Glucose, UA: NEGATIVE
HGB URINE DIPSTICK: NEGATIVE
KETONES UR: NEGATIVE
Leukocytes, UA: NEGATIVE
Nitrite: NEGATIVE
Protein, ur: NEGATIVE
Specific Gravity, Urine: 1.016 (ref 1.001–1.035)
WBC UA: NONE SEEN WBC/HPF (ref <=5)
Yeast: NONE SEEN [HPF]
pH: 6.5 (ref 5.0–8.0)

## 2015-04-21 LAB — PAP IG W/ RFLX HPV ASCU

## 2015-04-22 LAB — URINE CULTURE
Colony Count: NO GROWTH
ORGANISM ID, BACTERIA: NO GROWTH

## 2015-06-30 ENCOUNTER — Encounter: Payer: Self-pay | Admitting: Women's Health

## 2015-07-01 ENCOUNTER — Other Ambulatory Visit: Payer: Self-pay | Admitting: Women's Health

## 2015-07-01 DIAGNOSIS — Z3040 Encounter for surveillance of contraceptives, unspecified: Secondary | ICD-10-CM

## 2015-07-01 MED ORDER — NORGESTREL-ETHINYL ESTRADIOL 0.3-30 MG-MCG PO TABS: 1.0000 | ORAL_TABLET | Freq: Every day | ORAL | Status: DC

## 2015-12-21 ENCOUNTER — Other Ambulatory Visit: Payer: Self-pay | Admitting: Women's Health

## 2015-12-21 DIAGNOSIS — Z3040 Encounter for surveillance of contraceptives, unspecified: Secondary | ICD-10-CM

## 2016-01-03 ENCOUNTER — Other Ambulatory Visit: Payer: Self-pay | Admitting: Women's Health

## 2016-01-03 ENCOUNTER — Encounter: Payer: Self-pay | Admitting: Women's Health

## 2016-01-04 ENCOUNTER — Other Ambulatory Visit: Payer: Self-pay | Admitting: Women's Health

## 2016-01-04 DIAGNOSIS — Z3041 Encounter for surveillance of contraceptive pills: Secondary | ICD-10-CM

## 2016-01-04 MED ORDER — LEVONORGESTREL-ETHINYL ESTRAD 0.1-20 MG-MCG PO TABS: 1.0000 | ORAL_TABLET | Freq: Every day | ORAL | 4 refills | Status: DC

## 2016-04-20 ENCOUNTER — Encounter: Payer: Self-pay | Admitting: Women's Health

## 2016-04-20 ENCOUNTER — Ambulatory Visit (INDEPENDENT_AMBULATORY_CARE_PROVIDER_SITE_OTHER): Payer: BC Managed Care – PPO | Admitting: Women's Health

## 2016-04-20 VITALS — BP 112/70 | Ht 63.0 in | Wt 174.0 lb

## 2016-04-20 DIAGNOSIS — Z01419 Encounter for gynecological examination (general) (routine) without abnormal findings: Secondary | ICD-10-CM

## 2016-04-20 DIAGNOSIS — Z3041 Encounter for surveillance of contraceptive pills: Secondary | ICD-10-CM

## 2016-04-20 LAB — CBC WITH DIFFERENTIAL/PLATELET
BASOS PCT: 0 %
Basophils Absolute: 0 cells/uL (ref 0–200)
Eosinophils Absolute: 98 cells/uL (ref 15–500)
Eosinophils Relative: 1 %
HEMATOCRIT: 39.9 % (ref 35.0–45.0)
HEMOGLOBIN: 13.2 g/dL (ref 11.7–15.5)
LYMPHS ABS: 2254 {cells}/uL (ref 850–3900)
Lymphocytes Relative: 23 %
MCH: 30.6 pg (ref 27.0–33.0)
MCHC: 33.1 g/dL (ref 32.0–36.0)
MCV: 92.4 fL (ref 80.0–100.0)
MONO ABS: 686 {cells}/uL (ref 200–950)
MPV: 8.6 fL (ref 7.5–12.5)
Monocytes Relative: 7 %
NEUTROS ABS: 6762 {cells}/uL (ref 1500–7800)
Neutrophils Relative %: 69 %
Platelets: 404 10*3/uL — ABNORMAL HIGH (ref 140–400)
RBC: 4.32 MIL/uL (ref 3.80–5.10)
RDW: 13.8 % (ref 11.0–15.0)
WBC: 9.8 10*3/uL (ref 3.8–10.8)

## 2016-04-20 MED ORDER — NORGESTREL-ETHINYL ESTRADIOL 0.3-30 MG-MCG PO TABS: 1.0000 | ORAL_TABLET | Freq: Every day | ORAL | 1 refills | Status: DC

## 2016-04-20 NOTE — Patient Instructions (Signed)
Health Maintenance, Female Adopting a healthy lifestyle and getting preventive care can go a long way to promote health and wellness. Talk with your health care provider about what schedule of regular examinations is right for you. This is a good chance for you to check in with your provider about disease prevention and staying healthy. In between checkups, there are plenty of things you can do on your own. Experts have done a lot of research about which lifestyle changes and preventive measures are most likely to keep you healthy. Ask your health care provider for more information. Weight and diet Eat a healthy diet  Be sure to include plenty of vegetables, fruits, low-fat dairy products, and lean protein.  Do not eat a lot of foods high in solid fats, added sugars, or salt.  Get regular exercise. This is one of the most important things you can do for your health.  Most adults should exercise for at least 150 minutes each week. The exercise should increase your heart rate and make you sweat (moderate-intensity exercise).  Most adults should also do strengthening exercises at least twice a week. This is in addition to the moderate-intensity exercise. Maintain a healthy weight  Body mass index (BMI) is a measurement that can be used to identify possible weight problems. It estimates body fat based on height and weight. Your health care provider can help determine your BMI and help you achieve or maintain a healthy weight.  For females 76 years of age and older:  A BMI below 18.5 is considered underweight.  A BMI of 18.5 to 24.9 is normal.  A BMI of 25 to 29.9 is considered overweight.  A BMI of 30 and above is considered obese. Watch levels of cholesterol and blood lipids  You should start having your blood tested for lipids and cholesterol at 28 years of age, then have this test every 5 years.  You may need to have your cholesterol levels checked more often if:  Your lipid or  cholesterol levels are high.  You are older than 28 years of age.  You are at high risk for heart disease. Cancer screening Lung Cancer  Lung cancer screening is recommended for adults 64-42 years old who are at high risk for lung cancer because of a history of smoking.  A yearly low-dose CT scan of the lungs is recommended for people who:  Currently smoke.  Have quit within the past 15 years.  Have at least a 30-pack-year history of smoking. A pack year is smoking an average of one pack of cigarettes a day for 1 year.  Yearly screening should continue until it has been 15 years since you quit.  Yearly screening should stop if you develop a health problem that would prevent you from having lung cancer treatment. Breast Cancer  Practice breast self-awareness. This means understanding how your breasts normally appear and feel.  It also means doing regular breast self-exams. Let your health care provider know about any changes, no matter how small.  If you are in your 20s or 30s, you should have a clinical breast exam (CBE) by a health care provider every 1-3 years as part of a regular health exam.  If you are 34 or older, have a CBE every year. Also consider having a breast X-ray (mammogram) every year.  If you have a family history of breast cancer, talk to your health care provider about genetic screening.  If you are at high risk for breast cancer, talk  to your health care provider about having an MRI and a mammogram every year.  Breast cancer gene (BRCA) assessment is recommended for women who have family members with BRCA-related cancers. BRCA-related cancers include:  Breast.  Ovarian.  Tubal.  Peritoneal cancers.  Results of the assessment will determine the need for genetic counseling and BRCA1 and BRCA2 testing. Cervical Cancer  Your health care provider may recommend that you be screened regularly for cancer of the pelvic organs (ovaries, uterus, and vagina).  This screening involves a pelvic examination, including checking for microscopic changes to the surface of your cervix (Pap test). You may be encouraged to have this screening done every 3 years, beginning at age 53.  For women ages 60-65, health care providers may recommend pelvic exams and Pap testing every 3 years, or they may recommend the Pap and pelvic exam, combined with testing for human papilloma virus (HPV), every 5 years. Some types of HPV increase your risk of cervical cancer. Testing for HPV may also be done on women of any age with unclear Pap test results.  Other health care providers may not recommend any screening for nonpregnant women who are considered low risk for pelvic cancer and who do not have symptoms. Ask your health care provider if a screening pelvic exam is right for you.  If you have had past treatment for cervical cancer or a condition that could lead to cancer, you need Pap tests and screening for cancer for at least 20 years after your treatment. If Pap tests have been discontinued, your risk factors (such as having a new sexual partner) need to be reassessed to determine if screening should resume. Some women have medical problems that increase the chance of getting cervical cancer. In these cases, your health care provider may recommend more frequent screening and Pap tests. Colorectal Cancer  This type of cancer can be detected and often prevented.  Routine colorectal cancer screening usually begins at 28 years of age and continues through 28 years of age.  Your health care provider may recommend screening at an earlier age if you have risk factors for colon cancer.  Your health care provider may also recommend using home test kits to check for hidden blood in the stool.  A small camera at the end of a tube can be used to examine your colon directly (sigmoidoscopy or colonoscopy). This is done to check for the earliest forms of colorectal cancer.  Routine  screening usually begins at age 76.  Direct examination of the colon should be repeated every 5-10 years through 28 years of age. However, you may need to be screened more often if early forms of precancerous polyps or small growths are found. Skin Cancer  Check your skin from head to toe regularly.  Tell your health care provider about any new moles or changes in moles, especially if there is a change in a mole's shape or color.  Also tell your health care provider if you have a mole that is larger than the size of a pencil eraser.  Always use sunscreen. Apply sunscreen liberally and repeatedly throughout the day.  Protect yourself by wearing long sleeves, pants, a wide-brimmed hat, and sunglasses whenever you are outside. Heart disease, diabetes, and high blood pressure  High blood pressure causes heart disease and increases the risk of stroke. High blood pressure is more likely to develop in:  People who have blood pressure in the high end of the normal range (130-139/85-89 mm Hg).  People who are overweight or obese.  People who are African American.  If you are 59-24 years of age, have your blood pressure checked every 3-5 years. If you are 34 years of age or older, have your blood pressure checked every year. You should have your blood pressure measured twice-once when you are at a hospital or clinic, and once when you are not at a hospital or clinic. Record the average of the two measurements. To check your blood pressure when you are not at a hospital or clinic, you can use:  An automated blood pressure machine at a pharmacy.  A home blood pressure monitor.  If you are between 29 years and 60 years old, ask your health care provider if you should take aspirin to prevent strokes.  Have regular diabetes screenings. This involves taking a blood sample to check your fasting blood sugar level.  If you are at a normal weight and have a low risk for diabetes, have this test once  every three years after 28 years of age.  If you are overweight and have a high risk for diabetes, consider being tested at a younger age or more often. Preventing infection Hepatitis B  If you have a higher risk for hepatitis B, you should be screened for this virus. You are considered at high risk for hepatitis B if:  You were born in a country where hepatitis B is common. Ask your health care provider which countries are considered high risk.  Your parents were born in a high-risk country, and you have not been immunized against hepatitis B (hepatitis B vaccine).  You have HIV or AIDS.  You use needles to inject street drugs.  You live with someone who has hepatitis B.  You have had sex with someone who has hepatitis B.  You get hemodialysis treatment.  You take certain medicines for conditions, including cancer, organ transplantation, and autoimmune conditions. Hepatitis C  Blood testing is recommended for:  Everyone born from [birth year] through 1967.  Anyone with known risk factors for hepatitis C. Sexually transmitted infections (STIs)  You should be screened for sexually transmitted infections (STIs) including gonorrhea and chlamydia if:  You are sexually active and are younger than 28 years of age.  You are older than 28 years of age and your health care provider tells you that you are at risk for this type of infection.  Your sexual activity has changed since you were last screened and you are at an increased risk for chlamydia or gonorrhea. Ask your health care provider if you are at risk.  If you do not have HIV, but are at risk, it may be recommended that you take a prescription medicine daily to prevent HIV infection. This is called pre-exposure prophylaxis (PrEP). You are considered at risk if:  You are sexually active and do not regularly use condoms or know the HIV status of your partner(s).  You take drugs by injection.  You are sexually active with a partner  who has HIV. Talk with your health care provider about whether you are at high risk of being infected with HIV. If you choose to begin PrEP, you should first be tested for HIV. You should then be tested every 3 months for as long as you are taking PrEP. Pregnancy  If you are premenopausal and you may become pregnant, ask your health care provider about preconception counseling.  If you may become pregnant, take 400 to 800 micrograms (mcg) of folic acid  every day.  If you want to prevent pregnancy, talk to your health care provider about birth control (contraception). Osteoporosis and menopause  Osteoporosis is a disease in which the bones lose minerals and strength with aging. This can result in serious bone fractures. Your risk for osteoporosis can be identified using a bone density scan.  If you are 28 years of age or older, or if you are at risk for osteoporosis and fractures, ask your health care provider if you should be screened.  Ask your health care provider whether you should take a calcium or vitamin D supplement to lower your risk for osteoporosis.  Menopause may have certain physical symptoms and risks.  Hormone replacement therapy may reduce some of these symptoms and risks. Talk to your health care provider about whether hormone replacement therapy is right for you. Follow these instructions at home:  Schedule regular health, dental, and eye exams.  Stay current with your immunizations.  Do not use any tobacco products including cigarettes, chewing tobacco, or electronic cigarettes.  If you are pregnant, do not drink alcohol.  If you are breastfeeding, limit how much and how often you drink alcohol.  Limit alcohol intake to no more than 1 drink per day for nonpregnant women. One drink equals 12 ounces of beer, 5 ounces of wine, or 1 ounces of hard liquor.  Do not use street drugs.  Do not share needles.  Ask your health care provider for help if you need support  or information about quitting drugs.  Tell your health care provider if you often feel depressed.  Tell your health care provider if you have ever been abused or do not feel safe at home. This information is not intended to replace advice given to you by your health care provider. Make sure you discuss any questions you have with your health care provider. Document Released: 07/25/2010 Document Revised: 06/17/2015 Document Reviewed: 10/13/2014 Elsevier Interactive Patient Education  2017 [place] implant What is this medicine? ETONOGESTREL (et oh noe JES trel) is a contraceptive (birth control) device. It is used to prevent pregnancy. It can be used for up to 3 years. This medicine may be used for other purposes; ask your health care provider or pharmacist if you have questions. COMMON BRAND NAME(S): Implanon, Nexplanon What should I tell my health care provider before I take this medicine? They need to know if you have any of these conditions: -abnormal vaginal bleeding -blood vessel disease or blood clots -cancer of the breast, cervix, or liver -depression -diabetes -gallbladder disease -headaches -heart disease or recent heart attack -high blood pressure -high cholesterol -kidney disease -liver disease -renal disease -seizures -tobacco smoker -an unusual or allergic reaction to etonogestrel, other hormones, anesthetics or antiseptics, medicines, foods, dyes, or preservatives -pregnant or trying to get pregnant -breast-feeding How should I use this medicine? This device is inserted just under the skin on the inner side of your upper arm by a health care professional. Talk to your pediatrician regarding the use of this medicine in children. Special care may be needed. Overdosage: If you think you have taken too much of this medicine contact a poison control center or emergency room at once. NOTE: This medicine is only for you. Do not share this medicine with  others. What if I miss a dose? This does not apply. What may interact with this medicine? Do not take this medicine with any of the following medications: -amprenavir -bosentan -fosamprenavir This medicine may also interact with  the following medications: -barbiturate medicines for inducing sleep or treating seizures -certain medicines for fungal infections like ketoconazole and itraconazole -grapefruit juice -griseofulvin -medicines to treat seizures like carbamazepine, felbamate, oxcarbazepine, phenytoin, topiramate -modafinil -phenylbutazone -rifampin -rufinamide -some medicines to treat HIV infection like atazanavir, indinavir, lopinavir, nelfinavir, tipranavir, ritonavir -St. John's wort This list may not describe all possible interactions. Give your health care provider a list of all the medicines, herbs, non-prescription drugs, or dietary supplements you use. Also tell them if you smoke, drink alcohol, or use illegal drugs. Some items may interact with your medicine. What should I watch for while using this medicine? This product does not protect you against HIV infection (AIDS) or other sexually transmitted diseases. You should be able to feel the implant by pressing your fingertips over the skin where it was inserted. Contact your doctor if you cannot feel the implant, and use a non-hormonal birth control method (such as condoms) until your doctor confirms that the implant is in place. If you feel that the implant may have broken or become bent while in your arm, contact your healthcare provider. What side effects may I notice from receiving this medicine? Side effects that you should report to your doctor or health care professional as soon as possible: -allergic reactions like skin rash, itching or hives, swelling of the face, lips, or tongue -breast lumps -changes in emotions or moods -depressed mood -heavy or prolonged menstrual bleeding -pain, irritation, swelling, or  bruising at the insertion site -scar at site of insertion -signs of infection at the insertion site such as fever, and skin redness, pain or discharge -signs of pregnancy -signs and symptoms of a blood clot such as breathing problems; changes in vision; chest pain; severe, sudden headache; pain, swelling, warmth in the leg; trouble speaking; sudden numbness or weakness of the face, arm or leg -signs and symptoms of liver injury like dark yellow or brown urine; general ill feeling or flu-like symptoms; light-colored stools; loss of appetite; nausea; right upper belly pain; unusually weak or tired; yellowing of the eyes or skin -unusual vaginal bleeding, discharge -signs and symptoms of a stroke like changes in vision; confusion; trouble speaking or understanding; severe headaches; sudden numbness or weakness of the face, arm or leg; trouble walking; dizziness; loss of balance or coordination Side effects that usually do not require medical attention (report to your doctor or health care professional if they continue or are bothersome): -acne -back pain -breast pain -changes in weight -dizziness -general ill feeling or flu-like symptoms -headache -irregular menstrual bleeding -nausea -sore throat -vaginal irritation or inflammation This list may not describe all possible side effects. Call your doctor for medical advice about side effects. You may report side effects to FDA at 1-800-FDA-1088. Where should I keep my medicine? This drug is given in a hospital or clinic and will not be stored at home. NOTE: This sheet is a summary. It may not cover all possible information. If you have questions about this medicine, talk to your doctor, pharmacist, or health care provider.  2018 Elsevier/Gold Standard (2015-07-29 11:19:22)

## 2016-04-20 NOTE — Progress Notes (Signed)
Leslie Guerrero 10/15/1990 [id number]    History:    Presents for annual exam.  Regular monthly cycle on Cryselle. Continues to have migraines has not been able to be migraine free on preventative medication has follow-up scheduled with neurologist. Normal Pap history. Gardasil series completed. Same partner/engaged. Denies need for STD screening.  Past medical history, past surgical history, family history and social history were all reviewed and documented in the EPIC chart. 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 Grandparents diabetes.  ROS:  A ROS was performed and pertinent positives and negatives are included.  Exam:  Vitals:   04/20/16 1424  BP: 112/70  Weight: 174 lb (78.9 kg)  Height: 5\' 3"  (1.6 m)   Body mass index is 30.82 kg/m.   General appearance:  Normal Thyroid:  Symmetrical, normal in size, without palpable masses or nodularity. Respiratory  Auscultation:  Clear without wheezing or rhonchi Cardiovascular  Auscultation:  Regular rate, without rubs, murmurs or gallops  Edema/varicosities:  Not grossly evident Abdominal  Soft,nontender, without masses, guarding or rebound.  Liver/spleen:  No organomegaly noted  Hernia:  None appreciated  Skin  Inspection:  Grossly normal   Breasts: Examined lying and sitting.     Right: Without masses, retractions, discharge or axillary adenopathy.     Left: Without masses, retractions, discharge or axillary adenopathy. Gentitourinary   Inguinal/mons:  Normal without inguinal adenopathy  External genitalia:  Normal  BUS/Urethra/Skene's glands:  Normal  Vagina:  Normal  Cervix:  Normal  Uterus:   normal in size, shape and contour.  Midline and mobile  Adnexa/parametria:     Rt: Without masses or tenderness.   Lt: Without masses or tenderness.  Anus and perineum: Normal   Assessment/Plan:  26 y.o. S WF G0  for annual exam with no complaints.  Monthly cycle on Cryselle Contraception  management Migraines-neurologist managing  Plan: Reviewed avoidance of estrogen may help with headaches. Has had Nexplanon in the past would like to try again, will check coverage, have Dr. Phineas Real place with cycle. Reviewed possible weight gain, spotting and irregular cycles with Nexplanon. SBE's, continue regular exercise currently exercising 5 days weekly, MVI daily or folic acid daily encouraged. CBC,  Pap normal 2017, new screening guidelines reviewed.  [facility], 2:59 PM 04/20/2016

## 2016-04-25 ENCOUNTER — Encounter: Payer: Self-pay | Admitting: Gynecology

## 2016-04-25 ENCOUNTER — Ambulatory Visit (INDEPENDENT_AMBULATORY_CARE_PROVIDER_SITE_OTHER): Payer: BC Managed Care – PPO | Admitting: Gynecology

## 2016-04-25 VITALS — BP 116/74

## 2016-04-25 DIAGNOSIS — Z30017 Encounter for initial prescription of implantable subdermal contraceptive: Secondary | ICD-10-CM

## 2016-04-25 HISTORY — PX: OTHER SURGICAL HISTORY: SHX169

## 2016-04-25 NOTE — Progress Notes (Signed)
    Leslie Guerrero 15-Jul-1990 [id number]        25 y.o.  G0P0 presents for Nexplanon insertion. She previously has been counseled for her contraceptive options and she elects for nexplanon. I reviewed the Nexplanon insertional process and the side effects/risks. I reviewed irregular bleeding, insertion site infections, underlying neurovascular damage with permanent sequela, migration of the implant making removal difficult requiring surgery, the need to have it removed in 3 years under a separate procedure and lastly the risk of failure with pregnancy. Patient is currently ending a normal menses and she is right-handed. She has read through and signed the consent form.  Procedure with [name] assistant: Left upper arm examined and marked according to manufacturer's recommendation. The insertion site was cleansed with Betadine solution and the insertional tract infiltrated with 1% lidocaine. The Nexplanon was placed according to manufacturer's recommendation without difficulty. The skin defect was closed with a Steri-Strip. The patient palpated the rod. A pressure dressing was applied and postoperative instructions give her.   Lot number:  [id number]     Anastasio Auerbach MD, 4:19 PM 04/25/2016

## 2016-04-25 NOTE — Patient Instructions (Signed)
Etonogestrel implant What is this medicine? ETONOGESTREL (et oh noe JES trel) is a contraceptive (birth control) device. It is used to prevent pregnancy. It can be used for up to 3 years. This medicine may be used for other purposes; ask your health care provider or pharmacist if you have questions. COMMON BRAND NAME(S): Implanon, Nexplanon What should I tell my health care provider before I take this medicine? They need to know if you have any of these conditions: -abnormal vaginal bleeding -blood vessel disease or blood clots -cancer of the breast, cervix, or liver -depression -diabetes -gallbladder disease -headaches -heart disease or recent heart attack -high blood pressure -high cholesterol -kidney disease -liver disease -renal disease -seizures -tobacco smoker -an unusual or allergic reaction to etonogestrel, other hormones, anesthetics or antiseptics, medicines, foods, dyes, or preservatives -pregnant or trying to get pregnant -breast-feeding How should I use this medicine? This device is inserted just under the skin on the inner side of your upper arm by a health care professional. Talk to your pediatrician regarding the use of this medicine in children. Special care may be needed. Overdosage: If you think you have taken too much of this medicine contact a poison control center or emergency room at once. NOTE: This medicine is only for you. Do not share this medicine with others. What if I miss a dose? This does not apply. What may interact with this medicine? Do not take this medicine with any of the following medications: -amprenavir -bosentan -fosamprenavir This medicine may also interact with the following medications: -barbiturate medicines for inducing sleep or treating seizures -certain medicines for fungal infections like ketoconazole and itraconazole -grapefruit juice -griseofulvin -medicines to treat seizures like carbamazepine, felbamate, oxcarbazepine,  phenytoin, topiramate -modafinil -phenylbutazone -rifampin -rufinamide -some medicines to treat HIV infection like atazanavir, indinavir, lopinavir, nelfinavir, tipranavir, ritonavir -St. John's wort This list may not describe all possible interactions. Give your health care provider a list of all the medicines, herbs, non-prescription drugs, or dietary supplements you use. Also tell them if you smoke, drink alcohol, or use illegal drugs. Some items may interact with your medicine. What should I watch for while using this medicine? This product does not protect you against HIV infection (AIDS) or other sexually transmitted diseases. You should be able to feel the implant by pressing your fingertips over the skin where it was inserted. Contact your doctor if you cannot feel the implant, and use a non-hormonal birth control method (such as condoms) until your doctor confirms that the implant is in place. If you feel that the implant may have broken or become bent while in your arm, contact your healthcare provider. What side effects may I notice from receiving this medicine? Side effects that you should report to your doctor or health care professional as soon as possible: -allergic reactions like skin rash, itching or hives, swelling of the face, lips, or tongue -breast lumps -changes in emotions or moods -depressed mood -heavy or prolonged menstrual bleeding -pain, irritation, swelling, or bruising at the insertion site -scar at site of insertion -signs of infection at the insertion site such as fever, and skin redness, pain or discharge -signs of pregnancy -signs and symptoms of a blood clot such as breathing problems; changes in vision; chest pain; severe, sudden headache; pain, swelling, warmth in the leg; trouble speaking; sudden numbness or weakness of the face, arm or leg -signs and symptoms of liver injury like dark yellow or brown urine; general ill feeling or flu-like symptoms;  light-colored   stools; loss of appetite; nausea; right upper belly pain; unusually weak or tired; yellowing of the eyes or skin -unusual vaginal bleeding, discharge -signs and symptoms of a stroke like changes in vision; confusion; trouble speaking or understanding; severe headaches; sudden numbness or weakness of the face, arm or leg; trouble walking; dizziness; loss of balance or coordination Side effects that usually do not require medical attention (report to your doctor or health care professional if they continue or are bothersome): -acne -back pain -breast pain -changes in weight -dizziness -general ill feeling or flu-like symptoms -headache -irregular menstrual bleeding -nausea -sore throat -vaginal irritation or inflammation This list may not describe all possible side effects. Call your doctor for medical advice about side effects. You may report side effects to FDA at 1-800-FDA-1088. Where should I keep my medicine? This drug is given in a hospital or clinic and will not be stored at home. NOTE: This sheet is a summary. It may not cover all possible information. If you have questions about this medicine, talk to your doctor, pharmacist, or health care provider.  2018 Elsevier/Gold Standard (2015-07-29 11:19:22)  

## 2016-09-20 ENCOUNTER — Encounter: Payer: Self-pay | Admitting: Women's Health

## 2016-09-21 ENCOUNTER — Encounter: Payer: Self-pay | Admitting: Gynecology

## 2016-09-21 ENCOUNTER — Ambulatory Visit (INDEPENDENT_AMBULATORY_CARE_PROVIDER_SITE_OTHER): Payer: BC Managed Care – PPO | Admitting: Gynecology

## 2016-09-21 ENCOUNTER — Telehealth: Payer: Self-pay | Admitting: *Deleted

## 2016-09-21 VITALS — BP 116/74

## 2016-09-21 DIAGNOSIS — R2232 Localized swelling, mass and lump, left upper limb: Secondary | ICD-10-CM
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
## 2016-09-21 NOTE — Telephone Encounter (Signed)
Appointment at breast center on 10/05/16 @ 12:45 pt aware of time and date.

## 2016-09-21 NOTE — Telephone Encounter (Signed)
-----   Message from Anastasio Auerbach, MD sent at 09/21/2016  2:38 PM EDT ----- Range for left axillary ultrasound and left breast mammography reference left axillary nodularity tender to the patient.

## 2016-09-21 NOTE — Patient Instructions (Signed)
Office will call you to arrange for the ultrasound of the left axillary area

## 2016-10-05 ENCOUNTER — Ambulatory Visit
Admission: RE | Admit: 2016-10-05 | Discharge: 2016-10-05 | Disposition: A | Discharge status: Home / Self Care | Payer: BC Managed Care – PPO | Source: Ambulatory Visit | Attending: Gynecology | Admitting: Gynecology

## 2016-10-05 DIAGNOSIS — R2232 Localized swelling, mass and lump, left upper limb: Secondary | ICD-10-CM

## 2017-09-11 ENCOUNTER — Encounter: Payer: Self-pay | Admitting: Gynecology

## 2017-09-12 ENCOUNTER — Telehealth: Payer: Self-pay | Admitting: *Deleted

## 2017-09-12 DIAGNOSIS — N6320 Unspecified lump in the left breast, unspecified quadrant: Secondary | ICD-10-CM

## 2017-09-12 NOTE — Telephone Encounter (Signed)
-----   Message from Ramond Craver, [place] sent at 09/11/2017  3:43 PM EDT ----- Regarding: referral to surgeon "Tell patient the ultrasound did not show any abnormal tissue or masses. I would recommend following this with self exams as long as this remains stable then I would follow. If there is a persistent palpable area then I would recommend being followed up by a general surgeon who could consider excising this area."   Patient wants a referral to general surgeon as this has persisted.  Thanks

## 2017-09-12 NOTE — Telephone Encounter (Signed)
Referral placed at [facility] they will call to schedule. 

## 2017-09-13 NOTE — Telephone Encounter (Signed)
Patient scheduled on 09/21/17 with Dr. Brantley Stage

## 2017-09-15 ENCOUNTER — Other Ambulatory Visit: Payer: Self-pay

## 2017-09-15 ENCOUNTER — Observation Stay (HOSPITAL_COMMUNITY)
Admission: EM | Admit: 2017-09-15 | Discharge: 2017-09-17 | Disposition: A | Discharge status: Home / Self Care | Payer: BC Managed Care – PPO | Attending: Student in an Organized Health Care Education/Training Program | Admitting: Student in an Organized Health Care Education/Training Program

## 2017-09-15 ENCOUNTER — Emergency Department (HOSPITAL_COMMUNITY)
Admission: EM | Admit: 2017-09-15 | Discharge: 2017-09-15 | Disposition: A | Discharge status: Home / Self Care | Payer: Self-pay

## 2017-09-15 DIAGNOSIS — S41111A Laceration without foreign body of right upper arm, initial encounter: Secondary | ICD-10-CM | POA: Diagnosis not present

## 2017-09-15 DIAGNOSIS — Z888 Allergy status to other drugs, medicaments and biological substances status: Secondary | ICD-10-CM | POA: Insufficient documentation

## 2017-09-15 DIAGNOSIS — Z23 Encounter for immunization: Secondary | ICD-10-CM | POA: Diagnosis not present

## 2017-09-15 DIAGNOSIS — F419 Anxiety disorder, unspecified: Secondary | ICD-10-CM | POA: Diagnosis not present

## 2017-09-15 DIAGNOSIS — X58XXXA Exposure to other specified factors, initial encounter: Secondary | ICD-10-CM | POA: Diagnosis not present

## 2017-09-15 DIAGNOSIS — Z823 Family history of stroke: Secondary | ICD-10-CM | POA: Insufficient documentation

## 2017-09-15 DIAGNOSIS — R000 Tachycardia, unspecified: Secondary | ICD-10-CM

## 2017-09-15 DIAGNOSIS — Z79899 Other long term (current) drug therapy: Secondary | ICD-10-CM | POA: Insufficient documentation

## 2017-09-15 DIAGNOSIS — G43909 Migraine, unspecified, not intractable, without status migrainosus: Secondary | ICD-10-CM | POA: Insufficient documentation

## 2017-09-15 DIAGNOSIS — R55 Syncope and collapse: Secondary | ICD-10-CM

## 2017-09-15 MED ORDER — LIDOCAINE-EPINEPHRINE (PF) 2 %-1:200000 IJ SOLN
20.0000 mL | Freq: Once | INTRAMUSCULAR | Status: AC
  Administered 2017-09-15: 20 mL via INTRADERMAL
  Filled 2017-09-15: qty 20
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
## 2017-09-16 ENCOUNTER — Emergency Department (HOSPITAL_COMMUNITY): Payer: BC Managed Care – PPO

## 2017-09-16 ENCOUNTER — Encounter (HOSPITAL_COMMUNITY): Payer: Self-pay

## 2017-09-16 DIAGNOSIS — G43909 Migraine, unspecified, not intractable, without status migrainosus: Secondary | ICD-10-CM | POA: Diagnosis not present

## 2017-09-16 DIAGNOSIS — Z888 Allergy status to other drugs, medicaments and biological substances status: Secondary | ICD-10-CM

## 2017-09-16 DIAGNOSIS — Z975 Presence of (intrauterine) contraceptive device: Secondary | ICD-10-CM

## 2017-09-16 DIAGNOSIS — D473 Essential (hemorrhagic) thrombocythemia: Secondary | ICD-10-CM

## 2017-09-16 DIAGNOSIS — S41111A Laceration without foreign body of right upper arm, initial encounter: Secondary | ICD-10-CM

## 2017-09-16 DIAGNOSIS — Z91048 Other nonmedicinal substance allergy status: Secondary | ICD-10-CM

## 2017-09-16 DIAGNOSIS — F411 Generalized anxiety disorder: Secondary | ICD-10-CM

## 2017-09-16 DIAGNOSIS — R000 Tachycardia, unspecified: Secondary | ICD-10-CM | POA: Diagnosis present

## 2017-09-16 DIAGNOSIS — Z79899 Other long term (current) drug therapy: Secondary | ICD-10-CM

## 2017-09-16 LAB — URINALYSIS, ROUTINE W REFLEX MICROSCOPIC
Bilirubin Urine: NEGATIVE
Glucose, UA: NEGATIVE mg/dL
Hgb urine dipstick: NEGATIVE
Ketones, ur: NEGATIVE mg/dL
LEUKOCYTES UA: NEGATIVE
Nitrite: NEGATIVE
PROTEIN: NEGATIVE mg/dL
Specific Gravity, Urine: 1.039 — ABNORMAL HIGH (ref 1.005–1.030)
pH: 5 (ref 5.0–8.0)

## 2017-09-16 LAB — RAPID URINE DRUG SCREEN, HOSP PERFORMED
Amphetamines: NOT DETECTED
BARBITURATES: NOT DETECTED
Benzodiazepines: NOT DETECTED
Cocaine: NOT DETECTED
Opiates: NOT DETECTED
Tetrahydrocannabinol: NOT DETECTED

## 2017-09-16 LAB — CBC WITH DIFFERENTIAL/PLATELET
Abs Immature Granulocytes: 0 10*3/uL (ref 0.0–0.1)
Basophils Absolute: 0 10*3/uL (ref 0.0–0.1)
Basophils Relative: 0 %
EOS ABS: 0 10*3/uL (ref 0.0–0.7)
EOS PCT: 0 %
HEMATOCRIT: 39.2 % (ref 36.0–46.0)
HEMOGLOBIN: 13 g/dL (ref 12.0–15.0)
Immature Granulocytes: 0 %
LYMPHS PCT: 26 %
Lymphs Abs: 2.9 10*3/uL (ref 0.7–4.0)
MCH: 31.5 pg (ref 26.0–34.0)
MCHC: 33.2 g/dL (ref 30.0–36.0)
MCV: 94.9 fL (ref 78.0–100.0)
MONO ABS: 0.8 10*3/uL (ref 0.1–1.0)
Monocytes Relative: 7 %
Neutro Abs: 7.5 10*3/uL (ref 1.7–7.7)
Neutrophils Relative %: 67 %
Platelets: 355 10*3/uL (ref 150–400)
RBC: 4.13 MIL/uL (ref 3.87–5.11)
RDW: 13 % (ref 11.5–15.5)
WBC: 11.2 10*3/uL — ABNORMAL HIGH (ref 4.0–10.5)

## 2017-09-16 LAB — COMPREHENSIVE METABOLIC PANEL
ALK PHOS: 56 U/L (ref 38–126)
ALT: 30 U/L (ref 0–44)
ANION GAP: 11 (ref 5–15)
AST: 28 U/L (ref 15–41)
Albumin: 3.8 g/dL (ref 3.5–5.0)
BILIRUBIN TOTAL: 0.5 mg/dL (ref 0.3–1.2)
BUN: 10 mg/dL (ref 6–20)
CALCIUM: 8.9 mg/dL (ref 8.9–10.3)
CO2: 24 mmol/L (ref 22–32)
Chloride: 109 mmol/L (ref 98–111)
Creatinine, Ser: 0.69 mg/dL (ref 0.44–1.00)
GFR calc non Af Amer: >60 mL/min (ref >60)
Glucose, Bld: 80 mg/dL (ref 70–99)
Potassium: 3.2 mmol/L — ABNORMAL LOW (ref 3.5–5.1)
Sodium: 144 mmol/L (ref 135–145)
TOTAL PROTEIN: 7.2 g/dL (ref 6.5–8.1)

## 2017-09-16 LAB — CBC
HCT: 35.9 % — ABNORMAL LOW (ref 36.0–46.0)
Hemoglobin: 11.8 g/dL — ABNORMAL LOW (ref 12.0–15.0)
MCH: 31.2 pg (ref 26.0–34.0)
MCHC: 32.9 g/dL (ref 30.0–36.0)
MCV: 95 fL (ref 78.0–100.0)
PLATELETS: 350 10*3/uL (ref 150–400)
RBC: 3.78 MIL/uL — AB (ref 3.87–5.11)
RDW: 13.2 % (ref 11.5–15.5)
WBC: 8.1 10*3/uL (ref 4.0–10.5)

## 2017-09-16 LAB — I-STAT BETA HCG BLOOD, ED (MC, WL, AP ONLY): I-stat hCG, quantitative: <5 m[IU]/mL (ref <5)

## 2017-09-16 LAB — TSH: TSH: 1.574 u[IU]/mL (ref 0.350–4.500)

## 2017-09-16 LAB — CBG MONITORING, ED: GLUCOSE-CAPILLARY: 79 mg/dL (ref 70–99)

## 2017-09-16 LAB — PREGNANCY, URINE: Preg Test, Ur: NEGATIVE

## 2017-09-16 LAB — T4, FREE: FREE T4: 0.96 ng/dL (ref 0.82–1.77)

## 2017-09-16 MED ORDER — IOPAMIDOL (ISOVUE-300) INJECTION 61%
100.0000 mL | Freq: Once | INTRAVENOUS | Status: AC | PRN
  Administered 2017-09-16: 100 mL via INTRAVENOUS

## 2017-09-16 MED ORDER — POTASSIUM CHLORIDE CRYS ER 20 MEQ PO TBCR
40.0000 meq | EXTENDED_RELEASE_TABLET | Freq: Two times a day (BID) | ORAL | Status: AC
  Administered 2017-09-16 (×2): 40 meq via ORAL
  Filled 2017-09-16 (×2): qty 2

## 2017-09-16 MED ORDER — SODIUM CHLORIDE 0.9 % IV BOLUS
1000.0000 mL | Freq: Once | INTRAVENOUS | Status: AC
  Administered 2017-09-16: 1000 mL via INTRAVENOUS

## 2017-09-16 MED ORDER — LORAZEPAM 2 MG/ML IJ SOLN
0.5000 mg | Freq: Once | INTRAMUSCULAR | Status: AC
  Administered 2017-09-16: 0.5 mg via INTRAVENOUS
  Filled 2017-09-16: qty 1

## 2017-09-16 MED ORDER — DOXYCYCLINE HYCLATE 100 MG PO CAPS: 100.0000 mg | ORAL_CAPSULE | Freq: Two times a day (BID) | ORAL | 0 refills | Status: DC

## 2017-09-16 MED ORDER — KETOROLAC TROMETHAMINE 15 MG/ML IJ SOLN
15.0000 mg | Freq: Once | INTRAMUSCULAR | Status: AC
  Administered 2017-09-16: 15 mg via INTRAVENOUS
  Filled 2017-09-16: qty 1

## 2017-09-16 MED ORDER — LORAZEPAM 1 MG PO TABS: 1.5000 mg | ORAL_TABLET | Freq: Once | ORAL | Status: DC

## 2017-09-16 MED ORDER — IOPAMIDOL (ISOVUE-300) INJECTION 61%
INTRAVENOUS | Status: AC
  Filled 2017-09-16: qty 100

## 2017-09-16 MED ORDER — ENOXAPARIN SODIUM 40 MG/0.4ML ~~LOC~~ SOLN
40.0000 mg | SUBCUTANEOUS | Status: DC
  Administered 2017-09-16: 40 mg via SUBCUTANEOUS
  Filled 2017-09-16: qty 0.4

## 2017-09-16 MED ORDER — TETANUS-DIPHTH-ACELL PERTUSSIS 5-2.5-18.5 LF-MCG/0.5 IM SUSP
0.5000 mL | Freq: Once | INTRAMUSCULAR | Status: AC
  Administered 2017-09-16: 0.5 mL via INTRAMUSCULAR
  Filled 2017-09-16: qty 0.5

## 2017-09-16 MED ORDER — ACETAMINOPHEN 500 MG PO TABS
1000.0000 mg | ORAL_TABLET | Freq: Four times a day (QID) | ORAL | Status: DC | PRN
  Administered 2017-09-16: 1000 mg via ORAL
  Filled 2017-09-16: qty 2
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
## 2017-09-16 NOTE — Progress Notes (Signed)
Paged for persistent pain at the site of her proximal RUE laceration. Visited patient as bedside who appeared to be comfortable at rest, in no acute distress, nondiaphoretic, afebrile. The wound was uncovered and viewed (See Image).   As compared to the prior image, the wound appears intact w/ local ecchymosis from the injury but no notable signs of acute infection. The sutures have been applied with tightly approximated skin edges but currently appear clean, the tissue near the apex of the V shaped injury appears slightly necrotic and may not be viable. There was no evidence of acute local infection currently as the area was only mildly tender, not warm to touch, nonedematous, without exudate and clean. A dry 4x4 was applied to the area and wrapped in Kerlix prior to completing the exam.   Vitals:   09/16/17 1232 09/16/17 2106  BP: (!) 100/59 111/62  Pulse: (!) 117 (!) 113  Resp: 18 18  Temp: 98.2 F (36.8 C) 98.9 F (37.2 C)  SpO2: 100% 100%    Plan: We will provide a 15mg  dose of Toradol for the pain and continue to monitor.  The patient remains tachycardic but w/ otherwise normal vitals. Her HR continues to trend downward and will be monitored overnight.   The patient was advised on wound care but would benefit from specific discharge instruction on laceration wound care. The patient and family denied additional questions.   Kathi Ludwig, MD Internal Medicine PGY-2

## 2017-09-16 NOTE — Progress Notes (Signed)
Page to teaching services to notify that pt is on the floor.

## 2017-09-16 NOTE — ED Notes (Signed)
Ambulated pt. In hallway heartrate 146 o2sats 100.pt walked fine.

## 2017-09-16 NOTE — Discharge Instructions (Addendum)
Keep area clean by washing with soap and water daily after the initial 48 hours. Apply a bandage at least once daily, change more often if it is dirty Watch for signs of infection (redness, drainage, worsening pain) Have stitches removed in 7-10 days. There are 17 total   Laceration Care, Adult A laceration is a cut that goes through all layers of the skin. The cut also goes into the tissue that is right under the skin. Some cuts heal on their own. Others need to be closed with stitches (sutures), staples, skin adhesive strips, or wound glue. Taking care of your cut lowers your risk of infection and helps your cut to heal better. How to take care of your cut For stitches or staples:  Keep the wound clean and dry.  If you were given a bandage (dressing), you should change it at least one time per day or as told by your doctor. You should also change it if it gets wet or dirty.  Keep the wound completely dry for the first 48-72 hours or as instructed by your doctor. After that time, you may take a shower or a bath. However, make sure that the wound is not soaked in water until after the stitches or staples have been removed.  Clean the wound one time each day or as told by your doctor: ? Wash the wound with soap and water. ? Rinse the wound with water until all of the soap comes off. ? Pat the wound dry with a clean towel. Do not rub the wound.  After you clean the wound, put a thin layer of antibiotic ointment on it as told by your doctor. This ointment: ? Helps to prevent infection. ? Keeps the bandage from sticking to the wound.  Have your stitches or staples removed as told by your doctor. If your doctor used skin adhesive strips:  Keep the wound clean and dry.  If you were given a bandage, you should change it at least one time per day or as told by your doctor. You should also change it if it gets dirty or wet.  Do not get the skin adhesive strips wet. You can take a shower or a  bath, but be careful to keep the wound dry.  If the wound gets wet, pat it dry with a clean towel. Do not rub the wound.  Skin adhesive strips fall off on their own. You can trim the strips as the wound heals. Do not remove any strips that are still stuck to the wound. They will fall off after a while. If your doctor used wound glue:  Try to keep your wound dry, but you may briefly wet it in the shower or bath. Do not soak the wound in water, such as by swimming.  After you take a shower or a bath, gently pat the wound dry with a clean towel. Do not rub the wound.  Do not do any activities that will make you really sweaty until the skin glue has fallen off on its own.  Do not apply liquid, cream, or ointment medicine to your wound while the skin glue is still on.  If you were given a bandage, you should change it at least one time per day or as told by your doctor. You should also change it if it gets dirty or wet.  If a bandage is placed over the wound, do not let the tape for the bandage touch the skin glue.  Do  not pick at the glue. The skin glue usually stays on for 5-10 days. Then, it falls off of the skin. General Instructions  To help prevent scarring, make sure to cover your wound with sunscreen whenever you are outside after stitches are removed, after adhesive strips are removed, or when wound glue stays in place and the wound is healed. Make sure to wear a sunscreen of at least 30 SPF.  Take over-the-counter and prescription medicines only as told by your doctor.  If you were given antibiotic medicine or ointment, take or apply it as told by your doctor. Do not stop using the antibiotic even if your wound is getting better.  Do not scratch or pick at the wound.  Keep all follow-up visits as told by your doctor. This is important.  Check your wound every day for signs of infection. Watch for: ? Redness, swelling, or pain. ? Fluid, blood, or pus.  Raise (elevate) the  injured area above the level of your heart while you are sitting or lying down, if possible. Get help if:  You got a tetanus shot and you have any of these problems at the injection site: ? Swelling. ? Very bad pain. ? Redness. ? Bleeding.  You have a fever.  A wound that was closed breaks open.  You notice a bad smell coming from your wound or your bandage.  You notice something coming out of the wound, such as [place] or glass.  Medicine does not help your pain.  You have more redness, swelling, or pain at the site of your wound.  You have fluid, blood, or pus coming from your wound.  You notice a change in the color of your skin near your wound.  You need to change the bandage often because fluid, blood, or pus is coming from the wound.  You start to have a new rash.  You start to have numbness around the wound. Get help right away if:  You have very bad swelling around the wound.  Your pain suddenly gets worse and is very bad.  You notice painful lumps near the wound or on skin that is anywhere on your body.  You have a red streak going away from your wound.  The wound is on your hand or foot and you cannot move a finger or toe like you usually can.  The wound is on your hand or foot and you notice that your fingers or toes look pale or bluish. This information is not intended to replace advice given to you by your health care provider. Make sure you discuss any questions you have with your health care provider. Document Released: 06/28/2007 Document Revised: 06/17/2015 Document Reviewed: 01/05/2014 Elsevier Interactive Patient Education  2018 [place].    Postural Orthostatic Tachycardia Syndrome Postural orthostatic tachycardia syndrome (POTS) is a group of symptoms that can occur when you stand up after lying down. POTS happens when less blood flows to the heart than normal after you stand up. The reduced blood flow to the heart makes the heart beat  rapidly. POTS may be associated with another medical condition, or it may occur on its own. What are the causes? This cause of this condition is not known, but many conditions and diseases have been linked to it. What increases the risk? This condition is more likely to develop in:  Women 46-28 years old.  Women who are pregnant.  Women who are menstruating.  People who have certain conditions, including: ? A  viral infection. ? An autoimmune disease. ? Anemia. ? Dehydration. ? Hyperthyroidism.  People who take certain medicines.  People who have had a major injury.  People who have had surgery.  What are the signs or symptoms? The most common symptom of this condition is lightheadedness after standing up from a lying down position. Other symptoms may include:  Feeling a rapid increase in the speed of the heartbeat (tachycardia) within 10 minutes of standing up.  Fainting.  Weakness.  Confusion.  Trembling.  Shortness of breath.  Sweating or flushing.  Headache.  Chest pain.  Breathing that is deeper and faster than normal (hyperventilation).  Nausea.  Anxiety.  Symptoms may be worse in the morning, and they may be relieved by lying down. How is this diagnosed? This condition is diagnosed based on:  Your symptoms.  Your medical history.  A physical exam.  Measurements of your heart rate when you are lying down and after you stand up.  A measurement of your blood pressure. The measurement will be taken when you go from lying down to standing up.  Blood tests to measure hormones that change with blood pressure. The blood tests will be done when you are lying down and standing up.  You may have other tests to check whether you have a condition or disease that is linked to POTS. How is this treated? Treatment for this condition depends on how severe your symptoms are and whether you have any conditions or diseases that have been linked to POTS.  Treatment may involve:  Treating any conditions or diseases that have been linked to POTS.  Drinking two glasses of water before getting up from a lying position.  Eating more salt (sodium).  Taking medicine to control blood pressure and heart rate (beta-blocker).  Taking medicines to control blood flow, blood pressure, or heart rate.  Avoiding certain medicines.  Starting an exercise program under the supervision of your health care provider.  Follow these instructions at home:  Eating and drinking  Drink enough fluid to keep your urine clear or pale yellow.  If told by your health care provider, drink two glasses of water before getting up from a lying position.  Follow instructions from your health care provider about how much sodium you should eat.  Eat several small meals a day instead of a few large meals.  Avoid heavy meals. Medicines  Take over-the-counter and prescription medicines only as told by your health care provider.  Talk with your health care provider before starting any new medicines. Activity  Do an aerobic exercise for 20 minutes a day, at least 3 days a week.  Ask your health care provider what kinds of exercise are safe for you. Contact a health care provider if:  Your symptoms do not improve after treatment.  Your symptoms get worse.  You develop new symptoms. Get help right away if:  You have chest pain.  You have difficulty breathing.  You have fainting episodes. This information is not intended to replace advice given to you by your health care provider. Make sure you discuss any questions you have with your health care provider. Document Released: 12/30/2001 Document Revised: 02/17/2015 Document Reviewed: 07/24/2014 Elsevier Interactive Patient Education  [employer].
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
## 2017-09-16 NOTE — ED Notes (Signed)
Admission team in room at this time.

## 2017-09-16 NOTE — ED Notes (Signed)
Patient tolerated PO challenge

## 2017-09-17 DIAGNOSIS — I498 Other specified cardiac arrhythmias: Secondary | ICD-10-CM

## 2017-09-17 DIAGNOSIS — Z888 Allergy status to other drugs, medicaments and biological substances status: Secondary | ICD-10-CM | POA: Diagnosis not present

## 2017-09-17 DIAGNOSIS — S41111A Laceration without foreign body of right upper arm, initial encounter: Secondary | ICD-10-CM | POA: Diagnosis not present

## 2017-09-17 LAB — BASIC METABOLIC PANEL
ANION GAP: 7 (ref 5–15)
BUN: 6 mg/dL (ref 6–20)
CHLORIDE: 109 mmol/L (ref 98–111)
CO2: 24 mmol/L (ref 22–32)
CREATININE: 0.56 mg/dL (ref 0.44–1.00)
Calcium: 8.5 mg/dL — ABNORMAL LOW (ref 8.9–10.3)
GFR calc non Af Amer: >60 mL/min (ref >60)
Glucose, Bld: 96 mg/dL (ref 70–99)
POTASSIUM: 4.2 mmol/L (ref 3.5–5.1)
SODIUM: 140 mmol/L (ref 135–145)

## 2017-09-17 LAB — CBC
HEMATOCRIT: 36.6 % (ref 36.0–46.0)
Hemoglobin: 12 g/dL (ref 12.0–15.0)
MCH: 31.2 pg (ref 26.0–34.0)
MCHC: 32.8 g/dL (ref 30.0–36.0)
MCV: 95.1 fL (ref 78.0–100.0)
PLATELETS: 299 10*3/uL (ref 150–400)
RBC: 3.85 MIL/uL — ABNORMAL LOW (ref 3.87–5.11)
RDW: 13.3 % (ref 11.5–15.5)
WBC: 6.7 10*3/uL (ref 4.0–10.5)

## 2017-09-17 LAB — HIV ANTIBODY (ROUTINE TESTING W REFLEX): HIV Screen 4th Generation wRfx: NONREACTIVE

## 2017-09-17 MED ORDER — ACETAMINOPHEN 500 MG PO TABS: 1000.0000 mg | ORAL_TABLET | Freq: Four times a day (QID) | ORAL | 0 refills | Status: DC | PRN

## 2017-09-17 MED ORDER — ATENOLOL 25 MG PO TABS: 12.5000 mg | ORAL_TABLET | Freq: Every day | ORAL | 1 refills | Status: DC

## 2017-09-17 NOTE — Plan of Care (Signed)
  Problem: Education: Goal: Knowledge of General Education information will improve Description: Including pain rating scale, medication(s)/side effects and non-pharmacologic comfort measures Outcome: Completed/Met   Problem: Health Behavior/Discharge Planning: Goal: Ability to manage health-related needs will improve Outcome: Completed/Met   Problem: Clinical Measurements: Goal: Ability to maintain clinical measurements within normal limits will improve Outcome: Completed/Met Goal: Will remain free from infection Outcome: Completed/Met Goal: Diagnostic test results will improve Outcome: Completed/Met Goal: Respiratory complications will improve Outcome: Completed/Met Goal: Cardiovascular complication will be avoided Outcome: Completed/Met   Problem: Activity: Goal: Risk for activity intolerance will decrease Outcome: Completed/Met   Problem: Coping: Goal: Level of anxiety will decrease Outcome: Completed/Met   Problem: Elimination: Goal: Will not experience complications related to bowel motility Outcome: Completed/Met Goal: Will not experience complications related to urinary retention Outcome: Completed/Met   Problem: Pain Managment: Goal: General experience of comfort will improve Outcome: Completed/Met   Problem: Safety: Goal: Ability to remain free from injury will improve Outcome: Completed/Met   Problem: Skin Integrity: Goal: Risk for impaired skin integrity will decrease Outcome: Completed/Met   

## 2017-09-17 NOTE — Progress Notes (Signed)
Leslie Guerrero discharged home with spouse AVS went over with and given to  patient.  Patient taken to discharge lobby via ambulation by RN.   Vitals:   09/17/17 0640 09/17/17 0955  BP: 97/67   Pulse: (!) 103 (!) 109  Resp: 18   Temp: 97.9 F (36.6 C)   SpO2: 100%      Julieanne Cotton, RN
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
## 2017-10-26 ENCOUNTER — Encounter: Payer: Self-pay | Admitting: Cardiovascular Disease

## 2017-10-26 ENCOUNTER — Ambulatory Visit: Payer: BC Managed Care – PPO | Admitting: Cardiovascular Disease

## 2017-10-26 VITALS — BP 98/60 | HR 102 | Ht 62.0 in | Wt 185.0 lb

## 2017-10-26 DIAGNOSIS — R000 Tachycardia, unspecified: Secondary | ICD-10-CM | POA: Diagnosis not present

## 2017-10-26 DIAGNOSIS — G90A Postural orthostatic tachycardia syndrome (POTS): Secondary | ICD-10-CM

## 2017-10-26 DIAGNOSIS — I951 Orthostatic hypotension: Secondary | ICD-10-CM

## 2017-10-26 DIAGNOSIS — Z13220 Encounter for screening for lipoid disorders: Secondary | ICD-10-CM | POA: Diagnosis not present

## 2017-10-26 NOTE — Patient Instructions (Signed)
Medication Instructions:  STOP ATENOLOL   If you need a refill on your cardiac medications before your next appointment, please call your pharmacy.   Lab work: FASTING LP/CMET AT [facility]  If you have labs (blood work) drawn today and your tests are completely normal, you will receive your results only by: Marland Kitchen MyChart Message (if you have MyChart) OR . A paper copy in the mail If you have any lab test that is abnormal or we need to change your treatment, we will call you to review the results.  Testing/Procedures: Your physician has requested that you have an echocardiogram. Echocardiography is a painless test that uses sound waves to create images of your heart. It provides your doctor with information about the size and shape of your heart and how well your heart's chambers and valves are working. This procedure takes approximately one hour. There are no restrictions for this procedure. [facility] [address] TAKE THE ATENOLOL MORNING OF ECHO  Follow-Up: At [facility], you and your health needs are our priority.  As part of our continuing mission to provide you with exceptional heart care, we have created designated Provider Care Teams.  These Care Teams include your primary Cardiologist (physician) and Advanced Practice Providers (APPs -  Physician Assistants and Nurse Practitioners) who all work together to provide you with the care you need, when you need it. You will need a follow up appointment in 12 months.  Please call our office 2 months in advance to schedule this appointment. DR [facility] or one of the following Advanced Practice Providers on your designated Care Team:   Kerin Ransom, PA-C [place], [place] . Sande Rives, PA-C   Echocardiogram An echocardiogram, or echocardiography, uses sound waves (ultrasound) to produce an image of your heart. The echocardiogram is simple, painless, obtained within a short period  of time, and offers valuable information to your health care provider. The images from an echocardiogram can provide information such as:  Evidence of coronary artery disease (CAD).  Heart size.  Heart muscle function.  Heart valve function.  Aneurysm detection.  Evidence of a past heart attack.  Fluid buildup around the heart.  Heart muscle thickening.  Assess heart valve function.  Tell a health care provider about:  Any allergies you have.  All medicines you are taking, including vitamins, herbs, eye drops, creams, and over-the-counter medicines.  Any problems you or family members have had with anesthetic medicines.  Any blood disorders you have.  Any surgeries you have had.  Any medical conditions you have.  Whether you are pregnant or may be pregnant. What happens before the procedure? No special preparation is needed. Eat and drink normally. What happens during the procedure?  In order to produce an image of your heart, gel will be applied to your chest and a wand-like tool (transducer) will be moved over your chest. The gel will help transmit the sound waves from the transducer. The sound waves will harmlessly bounce off your heart to allow the heart images to be captured in real-time motion. These images will then be recorded.  You may need an IV to receive a medicine that improves the quality of the pictures. What happens after the procedure? You may return to your normal schedule including diet, activities, and medicines, unless your health care provider tells you otherwise. This information is not intended to replace advice given to you by your health care provider.  Make sure you discuss any questions you have with your health care provider. Document Released: 01/07/2000 Document Revised: 08/28/2015 Document Reviewed: 09/16/2012 Elsevier Interactive Patient Education  2017 [employer].

## 2017-10-26 NOTE — Progress Notes (Signed)
Cardiology Office Note   Date:  10/26/2017   ID:  Leslie Guerrero, DOB [birth year], MRN [id number]  PCP:  Arman Bogus., MD  Cardiologist:   Skeet Latch, MD   Chief Complaint  Patient presents with  . Follow-up      History of Present Illness: Leslie Guerrero is a 27 y.o. female with POTS and migraines who is being seen today for the evaluation of POTS at the request of [name], MD.  [name] was seen in the ED with a R arm laceration.  While there she was noted to be persistently tachycardic in the 130s to 140s despite receiving IV fluid and lorazepam.  She was not anxious at the time.  Thyroid function was within normal limits and she was not anemic.  Overnight her heart rate decreased to the low 100s.  EKG revealed sinus tachycardia.  Orthostatic vital signs revealed no change in her blood pressure but her heart rate increased from 106 lying to 140 after standing for 3 minutes.  She was started on atenolol and referred to cardiology as an outpatient.  She has not noticed any change in her heart rate since making this change.  However she has noted that her blood pressure has been lower.  She is known that her heart rate was elevated for many years.  She recalls that in high school she was never allowed to donate blood because her heart rate was always too high.  She exercises up to 5 days/week and has no exertional chest pain or shortness of breath with cardio or when lifting weights.  She has no dizziness or lightheadedness.  In the past she drank like caffeine but now only drinks 1 cup of coffee per day and then drinks mostly water.  She has a history of migraines and was previously on atenolol for that.  However it was discontinued because it was ineffective.  For the last year she has been on imipramine.  She is certain that her tachycardia predated this medication.  According to her watch her resting heart rate is in the upper 90s.  Of note, her father had a heart attack at age  83.   Past Medical History:  Diagnosis Date  . Migraines   . Thrombocytosis ([place]) 05/2011   platelet count 531,000    Past Surgical History:  Procedure Laterality Date  . Nexplanon insertion  04/25/2016     Current Outpatient Medications  Medication Sig Dispense Refill  . acetaminophen (TYLENOL) 500 MG tablet Take 2 tablets (1,000 mg total) by mouth every 6 (six) hours as needed for mild pain, moderate pain or headache. 30 tablet 0  . cyclobenzaprine (FLEXERIL) 10 MG tablet Take 10 mg by mouth as needed.  0  . Erenumab-aooe (AIMOVIG, 140 MG DOSE,) 70 MG/ML SOAJ Inject 70 mg into the skin every 30 (thirty) days.    Marland Kitchen imipramine (TOFRANIL) 50 MG tablet Take 50 mg by mouth at bedtime.    Marland Kitchen tiZANidine (ZANAFLEX) 4 MG tablet Take 4 mg by mouth as needed.     No current facility-administered medications for this visit.     Allergies:   Fluoxetine; Lavender oil; and Nickel    Social History:  The patient  reports that she has never smoked. She has never used smokeless tobacco. She reports that she drinks about 1.0 standard drinks of alcohol per week. She reports that she does not use drugs.   Family History:  The patient's family history includes Breast  cancer in her paternal grandmother; Diabetes in her paternal grandfather and paternal uncle; Fibromyalgia in her mother; Heart attack in her father; Hypertension in her father; Migraines in her mother; Stroke in her paternal grandfather.    ROS:  Please see the history of present illness.   Otherwise, review of systems are positive for none.   All other systems are reviewed and negative.    PHYSICAL EXAM: VS:  BP 98/60 (BP Location: Left Arm, Patient Position: Sitting, Cuff Size: Normal)   Pulse (!) 102   Ht 5\' 2"  (1.575 m)   Wt 185 lb (83.9 kg)   BMI 33.84 kg/m  , BMI Body mass index is 33.84 kg/m. GENERAL:  Well appearing HEENT:  Pupils equal round and reactive, fundi not visualized, oral mucosa unremarkable NECK:  No  jugular venous distention, waveform within normal limits, carotid upstroke brisk and symmetric, no bruits, no thyromegaly LYMPHATICS:  No cervical adenopathy LUNGS:  Clear to auscultation bilaterally HEART:  RRR.  PMI not displaced or sustained,S1 and S2 within normal limits, no S3, no S4, no clicks, no rubs, no murmurs ABD:  Flat, positive bowel sounds normal in frequency in pitch, no bruits, no rebound, no guarding, no midline pulsatile mass, no hepatomegaly, no splenomegaly EXT:  2 plus pulses throughout, no edema, no cyanosis no clubbing SKIN:  No rashes no nodules NEURO:  Cranial nerves II through XII grossly intact, motor grossly intact throughout PSYCH:  Cognitively intact, oriented to person place and time   EKG:  EKG is ordered today. The ekg ordered today demonstrates sinus tachycardia.  Rate 102 bpm.   Recent Labs: 09/16/2017: ALT 30; TSH 1.574 09/17/2017: BUN 6; Creatinine, Ser 0.56; Hemoglobin 12.0; Platelets 299; Potassium 4.2; Sodium 140    Lipid Panel No results found for: CHOL, TRIG, HDL, CHOLHDL, VLDL, LDLCALC, LDLDIRECT    Wt Readings from Last 3 Encounters:  10/26/17 185 lb (83.9 kg)  09/17/17 183 lb 6.4 oz (83.2 kg)  04/20/16 174 lb (78.9 kg)      ASSESSMENT AND PLAN:  # Inappropriate sinus tachycardia: # POTS: [name] has inappropriate sinus tachycardia.  By numbers she also has POTS.  However, she is completely asymptomatic.  Therefore, it is likely the treatment of beta-blockers is likely to make her symptomatic from hypotension rather than to help her tachycardia.  We will get an echocardiogram to ensure that she does not have any evidence of tachycardia induced cardiomyopathy.  She is already had the appropriate lab testing and limits her caffeine.  If she does have any evidence of heart failure we will start metoprolol instead of atenolol.  For now hold atenolol.  # Family history of premature CAD:  Ms. Coppock father had premature CAD.  She is completely  asymptomatic and exercises regularly.  We will get fasting lipids to better assess her underlying risk.  Current medicines are reviewed at length with the patient today.  The patient has concerns regarding medicines.  The following changes have been made:  no change  Labs/ tests ordered today include:   Orders Placed This Encounter  Procedures  . Lipid panel  . Comprehensive metabolic panel  . EKG 12-Lead  . ECHOCARDIOGRAM COMPLETE     Disposition:   FU with Danasia Baker C. Oval Linsey, MD, [facility] in 1 year.      Signed, Cole Eastridge C. Oval Linsey, MD, [facility]  10/26/2017 9:41 AM    [place]

## 2017-11-02 ENCOUNTER — Ambulatory Visit (HOSPITAL_COMMUNITY): Payer: BC Managed Care – PPO | Attending: Cardiology

## 2017-11-02 ENCOUNTER — Other Ambulatory Visit: Payer: BC Managed Care – PPO | Admitting: *Deleted

## 2017-11-02 ENCOUNTER — Other Ambulatory Visit: Payer: Self-pay

## 2017-11-02 DIAGNOSIS — R000 Tachycardia, unspecified: Secondary | ICD-10-CM

## 2017-11-02 DIAGNOSIS — Z13220 Encounter for screening for lipoid disorders: Secondary | ICD-10-CM

## 2017-11-02 LAB — COMPREHENSIVE METABOLIC PANEL
ALBUMIN: 4.2 g/dL (ref 3.5–5.5)
ALT: 28 IU/L (ref 0–32)
AST: 20 IU/L (ref 0–40)
Albumin/Globulin Ratio: 1.8 (ref 1.2–2.2)
Alkaline Phosphatase: 65 IU/L (ref 39–117)
BUN / CREAT RATIO: 17 (ref 9–23)
BUN: 13 mg/dL (ref 6–20)
Bilirubin Total: <0.2 mg/dL (ref 0.0–1.2)
CO2: 20 mmol/L (ref 20–29)
CREATININE: 0.76 mg/dL (ref 0.57–1.00)
Calcium: 8.9 mg/dL (ref 8.7–10.2)
Chloride: 104 mmol/L (ref 96–106)
GFR, EST AFRICAN AMERICAN: 123 mL/min/{1.73_m2} (ref >59)
GFR, EST NON AFRICAN AMERICAN: 106 mL/min/{1.73_m2} (ref >59)
GLOBULIN, TOTAL: 2.3 g/dL (ref 1.5–4.5)
Glucose: 89 mg/dL (ref 65–99)
Potassium: 4.3 mmol/L (ref 3.5–5.2)
Sodium: 140 mmol/L (ref 134–144)
Total Protein: 6.5 g/dL (ref 6.0–8.5)

## 2017-11-02 LAB — LIPID PANEL
CHOL/HDL RATIO: 2.8 ratio (ref 0.0–4.4)
Cholesterol, Total: 162 mg/dL (ref 100–199)
HDL: 58 mg/dL (ref >39)
LDL CALC: 92 mg/dL (ref 0–99)
Triglycerides: 62 mg/dL (ref 0–149)
VLDL CHOLESTEROL CAL: 12 mg/dL (ref 5–40)

## 2017-12-06 ENCOUNTER — Other Ambulatory Visit: Payer: Self-pay | Admitting: Surgery

## 2018-02-01 ENCOUNTER — Telehealth: Payer: Self-pay | Admitting: *Deleted

## 2018-02-01 NOTE — Telephone Encounter (Signed)
Patient sent a mychart message regarding labs from October, have not been able to respond via mychart. Called and gave her Dr Blenda Mounts response: that lab is not clinically relevant for her and it is in a normal range so I wouldn't worry about. It is more applicable for people with lung or kidney disease. Not for her.

## 2018-05-20 ENCOUNTER — Other Ambulatory Visit: Payer: Self-pay

## 2018-05-21 ENCOUNTER — Ambulatory Visit: Payer: BC Managed Care – PPO | Admitting: Women's Health

## 2018-05-21 ENCOUNTER — Encounter: Payer: Self-pay | Admitting: Women's Health

## 2018-05-21 ENCOUNTER — Other Ambulatory Visit: Payer: Self-pay

## 2018-05-21 VITALS — BP 126/80 | Ht 62.0 in | Wt 193.0 lb

## 2018-05-21 DIAGNOSIS — Z01419 Encounter for gynecological examination (general) (routine) without abnormal findings: Secondary | ICD-10-CM | POA: Diagnosis not present

## 2018-05-21 DIAGNOSIS — G43909 Migraine, unspecified, not intractable, without status migrainosus: Secondary | ICD-10-CM | POA: Insufficient documentation
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
## 2018-05-22 LAB — RUBELLA SCREEN: Rubella: 1.18 index

## 2018-05-22 NOTE — Addendum Note (Signed)
Addended by: Lorine Bears on: 05/22/2018 08:11 AM   Modules accepted: Orders

## 2018-05-23 LAB — PAP, TP IMAGING W/ HPV RNA, RFLX HPV TYPE 16,18/45: HPV DNA High Risk: NOT DETECTED

## 2018-10-14 ENCOUNTER — Encounter: Payer: Self-pay | Admitting: Gynecology

## 2019-01-21 ENCOUNTER — Telehealth (INDEPENDENT_AMBULATORY_CARE_PROVIDER_SITE_OTHER): Payer: BC Managed Care – PPO | Admitting: Cardiovascular Disease

## 2019-01-21 VITALS — Ht 62.0 in | Wt 198.0 lb

## 2019-01-21 DIAGNOSIS — G90A Postural orthostatic tachycardia syndrome (POTS): Secondary | ICD-10-CM

## 2019-01-21 DIAGNOSIS — E669 Obesity, unspecified: Secondary | ICD-10-CM

## 2019-01-21 DIAGNOSIS — R000 Tachycardia, unspecified: Secondary | ICD-10-CM

## 2019-01-21 DIAGNOSIS — I498 Other specified cardiac arrhythmias: Secondary | ICD-10-CM

## 2019-01-21 DIAGNOSIS — Z8249 Family history of ischemic heart disease and other diseases of the circulatory system: Secondary | ICD-10-CM

## 2019-01-21 NOTE — Progress Notes (Signed)
Virtual Visit via Video Note   This visit type was conducted due to national recommendations for restrictions regarding the COVID-19 Pandemic (e.g. social distancing) in an effort to limit this patient's exposure and mitigate transmission in our community.  Due to her co-morbid illnesses, this patient is at least at moderate risk for complications without adequate follow up.  This format is felt to be most appropriate for this patient at this time.  All issues noted in this document were discussed and addressed.  A limited physical exam was performed with this format.  Please refer to the patient's chart for her consent to telehealth for [facility].   Date:  01/21/2019   ID:  Leslie Guerrero, DOB 1990/11/11, MRN [id number]  Patient Location: Home Provider Location: Home  PCP:  Arman Bogus., MD  Cardiologist:  Skeet Latch, MD  Electrophysiologist:  None   Evaluation Performed:  Follow-Up Visit  Chief Complaint:  POTS  History of Present Illness:    Leslie Guerrero is a 28 y.o. female with POTS and family history of premature CAD here for follow up.  She was initially seen 10/2017 after being seen in the ED for an arm laceration.  While there she was noted to be persistently tachycardic in the 130s to 140s despite receiving IV fluid and lorazepam.  She was not anxious at the time.  Thyroid function was within normal limits and she was not anemic.  Overnight her heart rate decreased to the low 100s.  EKG revealed sinus tachycardia.  Orthostatic vital signs revealed no change in her blood pressure but her heart rate increased from 106 lying to 140 after standing for 3 minutes.  She was started on atenolol and referred to cardiology as an outpatient.  She did not notice any change in her heart rate since making this change.  She was found to have inappropriate sinus tachycardia and POTS, though she was asymptomatic.  Atenolol was making her hypotensive so it was discontinued.  She had an  echo 10/2017 that revealed LVEF 55 to 60% and was otherwise normal.  Given that her father had a heart attack at age 86, fasting lipids were checked which were well-controlled.  Since her last appointment [name] has been feeling well.  Her heart rate still is elevated but she is unsure of the exact numbers.  She remains asymptomatic and has not had any days where she felt like she initially did in the hospital.  She denies any chest pain or shortness of breath.  Prior to Covid she was exercising regularly in the gym.  Lately she has not been very active.  She denies any lower extremity edema, orthopnea, or PND.  She denies lightheadedness or dizziness.   The patient does not have symptoms concerning for COVID-19 infection (fever, chills, cough, or new shortness of breath).    Past Medical History:  Diagnosis Date  . Migraines   . Thrombocytosis ([place]) 05/2011   platelet count 531,000   Past Surgical History:  Procedure Laterality Date  . Nexplanon insertion  04/25/2016     Current Meds  Medication Sig  . acetaminophen (TYLENOL) 500 MG tablet Take 2 tablets (1,000 mg total) by mouth every 6 (six) hours as needed for mild pain, moderate pain or headache.  . cyclobenzaprine (FLEXERIL) 10 MG tablet Take 10 mg by mouth 3 (three) times daily as needed for muscle spasms.  Eduard Roux (AIMOVIG, 140 MG DOSE,) 70 MG/ML SOAJ Inject 70 mg into the skin every  30 (thirty) days.  Marland Kitchen escitalopram (LEXAPRO) 10 MG tablet Take by mouth.  . gabapentin (NEURONTIN) 300 MG capsule Take by mouth.  . zolpidem (AMBIEN CR) 6.25 MG CR tablet Take by mouth.     Allergies:   Fluoxetine, Lavender oil, and Nickel   Social History   Tobacco Use  . Smoking status: Never Smoker  . Smokeless tobacco: Never Used  Substance Use Topics  . Alcohol use: Yes    Alcohol/week: 1.0 standard drinks    Types: 1 Standard drinks or equivalent per week    Comment: socially  . Drug use: No     Family Hx: The patient's  family history includes Breast cancer in her paternal grandmother; Diabetes in her paternal grandfather and paternal uncle; Fibromyalgia in her mother; Heart attack in her father; Hypertension in her father; Migraines in her mother; Stroke in her paternal grandfather.  ROS:   Please see the history of present illness.     All other systems reviewed and are negative.   Prior CV studies:   The following studies were reviewed today:  Echo 11/02/17: Study Conclusions  - Left ventricle: The cavity size was normal. Wall thickness was   normal. Systolic function was normal. The estimated ejection   fraction was in the range of 55% to 60%. Wall motion was normal;   there were no regional wall motion abnormalities. Left   ventricular diastolic function parameters were normal.  Labs/Other Tests and Data Reviewed:    EKG:  No ECG reviewed.  Recent Labs: No results found for requested labs within last 8760 hours.   Recent Lipid Panel Lab Results  Component Value Date/Time   CHOL 162 11/02/2017 07:32 AM   TRIG 62 11/02/2017 07:32 AM   HDL 58 11/02/2017 07:32 AM   CHOLHDL 2.8 11/02/2017 07:32 AM   LDLCALC 92 11/02/2017 07:32 AM    Wt Readings from Last 3 Encounters:  01/21/19 198 lb (89.8 kg)  05/21/18 193 lb (87.5 kg)  10/26/17 185 lb (83.9 kg)     Objective:    Vital Signs:  Ht 5\' 2"  (1.575 m)   Wt 198 lb (89.8 kg)   BMI 36.21 kg/m    VITAL SIGNS:  reviewed GEN:  no acute distress EYES:  sclerae anicteric, EOMI - Extraocular Movements Intact RESPIRATORY:  normal respiratory effort, symmetric expansion CARDIOVASCULAR:  no peripheral edema SKIN:  no rash, lesions or ulcers. MUSCULOSKELETAL:  no obvious deformities. NEURO:  alert and oriented x 3, no obvious focal deficit PSYCH:  normal affect  ASSESSMENT & PLAN:    # Inappropriate sinus tachycardia: # POTS: [name] has inappropriate sinus tachycardia and POTS. She is unable to check her vitals at this time.  She  remains asymptomatic.  She became hypotensive with the use of beta-blockers.  Given that her echocardiogram was unremarkable and the fact that she is asymptomatic we will continue to avoid pharmacologic treatment.    # Family history of premature CAD: # Obesity: Ms. Striano father had premature CAD.  She is completely asymptomatic.  Lipids were well-controlled in 2019.  We discussed primary prevention by increasing her exercise to 150 minutes weekly and limiting fried and fatty foods.  BMI currently 6.   COVID-19 Education: The signs and symptoms of COVID-19 were discussed with the patient and how to seek care for testing (follow up with PCP or arrange E-visit).  The importance of social distancing was discussed today.  Time:   Today, I have spent 10 minutes  with the patient with telehealth technology discussing the above problems.     Medication Adjustments/Labs and Tests Ordered: Current medicines are reviewed at length with the patient today.  Concerns regarding medicines are outlined above.   Tests Ordered: No orders of the defined types were placed in this encounter.   Medication Changes: No orders of the defined types were placed in this encounter.   Follow Up:  prn    Signed, Skeet Latch, MD  01/21/2019 8:01 AM    [place]

## 2019-01-21 NOTE — Patient Instructions (Signed)
Medication Instructions:  Your Physician recommend you continue on your current medication as directed.    *If you need a refill on your cardiac medications before your next appointment, please call your pharmacy*  Lab Work: None  Testing/Procedures: None  Follow-Up: At [facility], you and your health needs are our priority.  As part of our continuing mission to provide you with exceptional heart care, we have created designated Provider Care Teams.  These Care Teams include your primary Cardiologist (physician) and Advanced Practice Providers (APPs -  Physician Assistants and Nurse Practitioners) who all work together to provide you with the care you need, when you need it.  Your next appointment:   As needed  The format for your next appointment:   Either In Person or Virtual  Provider:   Skeet Latch, MD
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
## 2019-08-07 ENCOUNTER — Encounter: Payer: BC Managed Care – PPO | Admitting: Nurse Practitioner

## 2019-08-13 ENCOUNTER — Encounter: Payer: Self-pay | Admitting: Nurse Practitioner

## 2019-08-13 ENCOUNTER — Ambulatory Visit (INDEPENDENT_AMBULATORY_CARE_PROVIDER_SITE_OTHER): Payer: BC Managed Care – PPO | Admitting: Nurse Practitioner

## 2019-08-13 ENCOUNTER — Other Ambulatory Visit: Payer: Self-pay

## 2019-08-13 VITALS — BP 118/76 | Ht 63.0 in | Wt 208.0 lb

## 2019-08-13 DIAGNOSIS — Z975 Presence of (intrauterine) contraceptive device: Secondary | ICD-10-CM | POA: Diagnosis not present

## 2019-08-13 DIAGNOSIS — Z01419 Encounter for gynecological examination (general) (routine) without abnormal findings: Secondary | ICD-10-CM | POA: Diagnosis not present

## 2019-08-13 LAB — CBC WITH DIFFERENTIAL/PLATELET
Absolute Monocytes: 525 cells/uL (ref 200–950)
Basophils Absolute: 28 cells/uL (ref 0–200)
Basophils Relative: 0.4 %
Eosinophils Absolute: 70 cells/uL (ref 15–500)
Eosinophils Relative: 1 %
HCT: 39.7 % (ref 35.0–45.0)
Hemoglobin: 13.1 g/dL (ref 11.7–15.5)
Lymphs Abs: 1365 cells/uL (ref 850–3900)
MCH: 30.4 pg (ref 27.0–33.0)
MCHC: 33 g/dL (ref 32.0–36.0)
MCV: 92.1 fL (ref 80.0–100.0)
MPV: 9.1 fL (ref 7.5–12.5)
Monocytes Relative: 7.5 %
Neutro Abs: 5012 cells/uL (ref 1500–7800)
Neutrophils Relative %: 71.6 %
Platelets: 468 10*3/uL — ABNORMAL HIGH (ref 140–400)
RBC: 4.31 10*6/uL (ref 3.80–5.10)
RDW: 12.9 % (ref 11.0–15.0)
Total Lymphocyte: 19.5 %
WBC: 7 10*3/uL (ref 3.8–10.8)

## 2019-08-13 LAB — COMPREHENSIVE METABOLIC PANEL
AG Ratio: 1.3 (calc) (ref 1.0–2.5)
ALT: 27 U/L (ref 6–29)
AST: 18 U/L (ref 10–30)
Albumin: 4.1 g/dL (ref 3.6–5.1)
Alkaline phosphatase (APISO): 82 U/L (ref 31–125)
BUN: 12 mg/dL (ref 7–25)
CO2: 22 mmol/L (ref 20–32)
Calcium: 9.1 mg/dL (ref 8.6–10.2)
Chloride: 106 mmol/L (ref 98–110)
Creat: 0.72 mg/dL (ref 0.50–1.10)
Globulin: 3.1 g/dL (calc) (ref 1.9–3.7)
Glucose, Bld: 85 mg/dL (ref 65–99)
Potassium: 4.2 mmol/L (ref 3.5–5.3)
Sodium: 138 mmol/L (ref 135–146)
Total Bilirubin: 0.3 mg/dL (ref 0.2–1.2)
Total Protein: 7.2 g/dL (ref 6.1–8.1)

## 2019-08-13 NOTE — Progress Notes (Signed)
   Leslie Guerrero [birth year] [id number]   History:  29 y.o. G0 presents for annual exam without GYN complaints.  Regular monthly cycles with Nexplanon placed 04/2016, appointment for next week to have this removed.  She is considering pregnancy and would like to do OCPs until they start trying. Gardasil series completed.  Migraines managed by neuro, doing well on weekly Aimovig injections.  Had a fatty mass removed in left axillary and says she has started to notice some soreness in that area with her cycles.  Gynecologic History Patient's last menstrual period was 08/04/2019. Period Cycle (Days): 28 Period Duration (Days): 7 Period Pattern: Regular Menstrual Flow: Moderate Dysmenorrhea: (!) Mild Dysmenorrhea Symptoms: Cramping Contraception: Nexplanon Last Pap: 05/22/2018. Results were: normal Last mammogram: 2018. Results were: normal   Past medical history, past surgical history, family history and social history were all reviewed and documented in the EPIC chart.  ROS:  A ROS was performed and pertinent positives and negatives are included.  Exam:  Vitals:   08/13/19 0859  BP: 118/76  Weight: 208 lb (94.3 kg)  Height: 5\' 3"  (1.6 m)   Body mass index is 36.85 kg/m.  General appearance:  Normal Thyroid:  Symmetrical, normal in size, without palpable masses or nodularity. Respiratory  Auscultation:  Clear without wheezing or rhonchi Cardiovascular  Auscultation:  Regular rate, without rubs, murmurs or gallops  Edema/varicosities:  Not grossly evident Abdominal  Soft,nontender, without masses, guarding or rebound.  Liver/spleen:  No organomegaly noted  Hernia:  None appreciated  Skin  Inspection:  Grossly normal   Breasts: Examined lying and sitting.   Right: Without masses, retractions, discharge or axillary adenopathy.   Left: Without masses, retractions, discharge or axillary adenopathy. Gentitourinary   Inguinal/mons:  Normal without inguinal adenopathy  External  genitalia:  Normal  BUS/Urethra/Skene's glands:  Normal  Vagina:  Normal  Cervix:  Normal  Uterus:  Anteverted, normal in size, shape and contour.  Midline and mobile  Adnexa/parametria:     Rt: Without masses or tenderness.   Lt: Without masses or tenderness.  Anus and perineum: Normal   Assessment/Plan:  29 y.o. G0 for annual exam.   Well female exam with routine gynecological exam - Plan: CBC with Differential/Platelet, Comprehensive metabolic panel. Education provided on SBEs, importance of preventative screenings, current guidelines, high calcium diet, regular exercise, and multivitamin daily.   Nexplanon in place - placed 04/2016. Appt next week to remove and start OCPs. Considering pregnancy.   Follow up in 1 year for annual       [name], 9:16 AM 08/13/2019

## 2019-08-13 NOTE — Patient Instructions (Signed)
Health Maintenance, Female Adopting a healthy lifestyle and getting preventive care are important in promoting health and wellness. Ask your health care provider about:  The right schedule for you to have regular tests and exams.  Things you can do on your own to prevent diseases and keep yourself healthy. What should I know about diet, weight, and exercise? Eat a healthy diet   Eat a diet that includes plenty of vegetables, fruits, low-fat dairy products, and lean protein.  Do not eat a lot of foods that are high in solid fats, added sugars, or sodium. Maintain a healthy weight Body mass index (BMI) is used to identify weight problems. It estimates body fat based on height and weight. Your health care provider can help determine your BMI and help you achieve or maintain a healthy weight. Get regular exercise Get regular exercise. This is one of the most important things you can do for your health. Most adults should:  Exercise for at least 150 minutes each week. The exercise should increase your heart rate and make you sweat (moderate-intensity exercise).  Do strengthening exercises at least twice a week. This is in addition to the moderate-intensity exercise.  Spend less time sitting. Even light physical activity can be beneficial. Watch cholesterol and blood lipids Have your blood tested for lipids and cholesterol at 31 years of age, then have this test every 5 years. Have your cholesterol levels checked more often if:  Your lipid or cholesterol levels are high.  You are older than 31 years of age.  You are at high risk for heart disease. What should I know about cancer screening? Depending on your health history and family history, you may need to have cancer screening at various ages. This may include screening for:  Breast cancer.  Cervical cancer.  Colorectal cancer.  Skin cancer.  Lung cancer. What should I know about heart disease, diabetes, and high blood  pressure? Blood pressure and heart disease  High blood pressure causes heart disease and increases the risk of stroke. This is more likely to develop in people who have high blood pressure readings, are of African descent, or are overweight.  Have your blood pressure checked: ? Every 3-5 years if you are 18-39 years of age. ? Every year if you are 40 years old or older. Diabetes Have regular diabetes screenings. This checks your fasting blood sugar level. Have the screening done:  Once every three years after age 40 if you are at a normal weight and have a low risk for diabetes.  More often and at a younger age if you are overweight or have a high risk for diabetes. What should I know about preventing infection? Hepatitis B If you have a higher risk for hepatitis B, you should be screened for this virus. Talk with your health care provider to find out if you are at risk for hepatitis B infection. Hepatitis C Testing is recommended for:  Everyone born from 1945 through 1965.  Anyone with known risk factors for hepatitis C. Sexually transmitted infections (STIs)  Get screened for STIs, including gonorrhea and chlamydia, if: ? You are sexually active and are younger than 31 years of age. ? You are older than 31 years of age and your health care provider tells you that you are at risk for this type of infection. ? Your sexual activity has changed since you were last screened, and you are at increased risk for chlamydia or gonorrhea. Ask your health care provider if   you are at risk.  Ask your health care provider about whether you are at high risk for HIV. Your health care provider may recommend a prescription medicine to help prevent HIV infection. If you choose to take medicine to prevent HIV, you should first get tested for HIV. You should then be tested every 3 months for as long as you are taking the medicine. Pregnancy  If you are about to stop having your period (premenopausal) and  you may become pregnant, seek counseling before you get pregnant.  Take 400 to 800 micrograms (mcg) of folic acid every day if you become pregnant.  Ask for birth control (contraception) if you want to prevent pregnancy. Osteoporosis and menopause Osteoporosis is a disease in which the bones lose minerals and strength with aging. This can result in bone fractures. If you are 65 years old or older, or if you are at risk for osteoporosis and fractures, ask your health care provider if you should:  Be screened for bone loss.  Take a calcium or vitamin D supplement to lower your risk of fractures.  Be given hormone replacement therapy (HRT) to treat symptoms of menopause. Follow these instructions at home: Lifestyle  Do not use any products that contain nicotine or tobacco, such as cigarettes, e-cigarettes, and chewing tobacco. If you need help quitting, ask your health care provider.  Do not use street drugs.  Do not share needles.  Ask your health care provider for help if you need support or information about quitting drugs. Alcohol use  Do not drink alcohol if: ? Your health care provider tells you not to drink. ? You are pregnant, may be pregnant, or are planning to become pregnant.  If you drink alcohol: ? Limit how much you use to 0-1 drink a day. ? Limit intake if you are breastfeeding.  Be aware of how much alcohol is in your drink. In the [place] one drink equals one 12 oz bottle of beer (355 mL), one 5 oz glass of wine (148 mL), or one 1 oz glass of hard liquor (44 mL). General instructions  Schedule regular health, dental, and eye exams.  Stay current with your vaccines.  Tell your health care provider if: ? You often feel depressed. ? You have ever been abused or do not feel safe at home. Summary  Adopting a healthy lifestyle and getting preventive care are important in promoting health and wellness.  Follow your health care provider's instructions about healthy  diet, exercising, and getting tested or screened for diseases.  Follow your health care provider's instructions on monitoring your cholesterol and blood pressure. This information is not intended to replace advice given to you by your health care provider. Make sure you discuss any questions you have with your health care provider. Document Revised: 01/02/2018 Document Reviewed: 01/02/2018 Elsevier Patient Education  2020 Else[employer] Inc[employer]  

## 2019-08-20 ENCOUNTER — Other Ambulatory Visit: Payer: Self-pay

## 2019-08-20 ENCOUNTER — Ambulatory Visit: Payer: BC Managed Care – PPO | Admitting: Obstetrics and Gynecology

## 2019-08-20 ENCOUNTER — Encounter: Payer: Self-pay | Admitting: Obstetrics and Gynecology

## 2019-08-20 VITALS — BP 118/78

## 2019-08-20 DIAGNOSIS — Z3009 Encounter for other general counseling and advice on contraception: Secondary | ICD-10-CM | POA: Diagnosis not present

## 2019-08-20 DIAGNOSIS — Z3046 Encounter for surveillance of implantable subdermal contraceptive: Secondary | ICD-10-CM

## 2019-08-20 MED ORDER — NORETHINDRONE 0.35 MG PO TABS: 1.0000 | ORAL_TABLET | Freq: Every day | ORAL | 3 refills | Status: DC

## 2019-08-20 NOTE — Progress Notes (Signed)
   Leslie Guerrero [birth year] [id number]  SUBJECTIVE:  29 y.o. G0P0 female presents for Nexplanon removal.  It was placed 04/2016.  She would like to start on birth control pills which she says she has used in the past.  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   Allergies: Fluoxetine, Lavender oil, and Nickel  Patient's last menstrual period was 08/04/2019.  Past medical history,surgical history, problem list, medications, allergies, family history and social history were all reviewed and documented as reviewed in the EPIC chart.   OBJECTIVE:  BP 118/78   LMP 08/04/2019   Nexplanon removal procedure note  The planned Nexplanon device was palpated in the patient's left arm. The site was cleansed with Betadine swab x3.  1 mL of 1% lidocaine was injected just beneath the skin where the skin incision was to be made over the old insertion site scar.   A change to sterile gloves was made.  Adequate anesthesia was confirmed.  A small nick was made in the skin with an 11 blade scalpel.  The Nexplanon device had a scar capsule around it which was bluntly dissected with the mosquito forceps.  Applying pressure to the arm presented the end of the Nexplanon device through the skin incision.  The device was grasped and it was moderately scarred in place.  Stronger blunt traction on the device resulted in removal in its entirety as visually confirmed by the patient and assistant.  The site was then dressed in a normal fashion.  The patient tolerated the procedure well.  [name] was present during the procedure.   ASSESSMENT:  29 y.o. G0P0 here for Nexplanon removal, birth control counseling  PLAN:  Nexplanon removed without difficulty today. We discussed other birth control options and I discussed with the patient that combined hormonal contraceptive options are contraindicated in patients with  migraine with aura symptoms (due to CVA risk).  Progestin only methods are safer.  We discussed Depo-Provera injection, hormonal IUD, nonhormonal IUDs, and the minipill (progestin only). Prescription given today for minipill, one tablet by mouth daily, continuous.  We discussed importance of consistent dosing, sensitivity of this method to missed doses or inconsistent timing of administration that may result in contraceptive failure.  Side effects are reviewed including headache, dizziness, abdominal pain, nausea, bloating, breakthrough bleeding, and breast tenderness.  She will follow-up if any problems.   Joseph Pierini MD 08/20/19

## 2020-04-29 ENCOUNTER — Other Ambulatory Visit: Payer: Self-pay

## 2020-04-29 ENCOUNTER — Encounter: Payer: Self-pay | Admitting: Nurse Practitioner

## 2020-04-29 ENCOUNTER — Ambulatory Visit: Payer: BC Managed Care – PPO | Admitting: Nurse Practitioner

## 2020-04-29 VITALS — BP 118/74

## 2020-04-29 DIAGNOSIS — N9089 Other specified noninflammatory disorders of vulva and perineum: Secondary | ICD-10-CM

## 2020-04-29 DIAGNOSIS — Z3009 Encounter for other general counseling and advice on contraception: Secondary | ICD-10-CM

## 2020-04-29 NOTE — Progress Notes (Signed)
   Acute Office Visit  Subjective:    Patient ID: Leslie Guerrero, female    DOB: August 30, 1990, 30 y.o.   MRN: [id number]   HPI 30 y.o. presents today discuss family planning. She plans to try to conceive this summer. She is currently taking Micronor. She also complains of bumps that come and go on vulva. She has one today.    Review of Systems  Constitutional: Negative.   Genitourinary: Positive for genital sores (small bump on labia).       Objective:    Physical Exam Constitutional:      Appearance: Normal appearance.  Genitourinary:      BP 118/74   LMP 04/15/2020  Wt Readings from Last 3 Encounters:  08/13/19 208 lb (94.3 kg)  01/21/19 198 lb (89.8 kg)  05/21/18 193 lb (87.5 kg)        Assessment & Plan:   Problem List Items Addressed This Visit   None   Visit Diagnoses    Family planning    -  Primary   Vulvar lump         Plan: We discussed prenatal vitamin, stopping contraception, medications, signs of ovulation, timing of intercourse, and safe pregnancy behaviors. All questions answered. Vulvar bump appears to be a pimple and was easily expressed. Recommend less friction from shaving and underwear.      Tamela Gammon DNP, 11:10 AM 04/29/2020

## 2020-04-29 NOTE — Patient Instructions (Signed)
Natural Family Planning Natural family planning (NFP) is a type of birth control (contraception) in which no medicine or device is used to prevent pregnancy. The NFP method relies on knowing which days of the month a woman's ovary is producing an egg (ovulation). Ovulation is the time in the menstrual cycle when a woman is most fertile and, therefore, most likely to become pregnant. To lower the chance of pregnancy, sex is avoided during ovulation. NFP is a safe method of birth control and can decrease the risk of pregnancy if it is done correctly. However, NFP does not provide protection from sexually transmitted diseases. NFP can also be used as a method of getting pregnant, by deciding to have sex during ovulation. How does the NFP method work? NFP works by making both sexual partners aware of how the woman's body functions during her menstrual cycle.  Usually, a woman has a menstrual period every 28-30 days. However, there can be 23-35 days between each menstrual period. This varies for each woman. A woman with a 28-day menstrual cycle has about 6 days a month when she is most likely to get pregnant.  Ovulation happens 12-14 days before the start of the next menstrual period. There are different methods that are used to determine when ovulation starts.  An egg is fertile for 24 hours after it is released from the ovary. Sperm can live for 3 days or more. What NFP methods can be used to prevent pregnancy? The basal body temperature method During ovulation, there is often a slight increase in body temperature. To use this method:  Take your temperature every morning before getting out of bed. Write the temperature on a chart.  Do not have sex from the day the menstrual period starts until 3 days after the increase in temperature. Note that body temperature may increase as a result of various factors, including fever, restless sleep, and working schedules.   The cervical mucus method Right  before ovulation, mucus from the lower part of the uterus (cervix) changes from dry and sticky to wet and slippery. To use this method:  Check the mucus every day to look for these changes. Ovulation happens on the last day of wet, slippery mucus.  Do not have sex starting when you first see wet, slippery mucus and until 4 days after it stops or returns to its normal consistency.  With this method, it is safe to have sex: ? After the 4 days have passed, and until 10 days after the menstrual period starts. ? On days when mucus is dry. Note that cervical mucus can increase or change consistency due to reasons other than ovulation, such as infection, lubricants, some medicines, and sexual arousal. Other variations of the cervical mucus method include the TwoDay method, [place] ovulation method, and the [employer]. The symptothermal method This method combines the basal body temperature and the cervical mucus methods. The calendar method This method involves tracking menstrual cycles to determine when ovulation occurs. This method is helpful when the menstrual cycle varies in length. To use this method:  For 6 months, record when menstrual periods start and end and the length of each menstrual cycle. The length of a menstrual cycle is from day 1 of the present menstrual period to day 1 of the next menstrual period.  Use this information to determine when you will likely ovulate. Avoid sex during that time. You may need help from your health care provider to determine which days you are most likely  to get pregnant. ? Ovulation usually happens 12-14 days before the start of the next menstrual period. ? Light vaginal bleeding (spotting) or abdominal cramps during the middle of a menstrual cycle may be signs of ovulation. However, not all women have these symptoms.   The standard days method This method is based on studies of women's hormone levels throughout normal menstrual cycles. Based on  these studies, a standard rule was developed to predict when women are most fertile during their menstrual cycle. According to the rule, if your cycle is 26-32 days long, you are most fertile between days 8 and 19. To prevent pregnancy, you should avoid having sex during this time, or use a barrier method of birth control. This method works best if your cycle is regularly between 26-32 days long. When should I not use the NFP method? You should not use NFP if:  You have very irregular menstrual periods or you sometimes skip a menstrual period.  You have abnormal vaginal bleeding.  You recently had a baby or are breastfeeding.  You have a vaginal or cervical infection.  You take medicines that can affect vaginal mucus or body temperature. These medicines include antibiotics, thyroid medicines, and antihistamines that are found in some cold and allergy medicines.  You absolutely do not want to become pregnant at the current time. Other methods of contraception are more effective at preventing pregnancy than NFP.  You are concerned about sexually transmitted diseases. Summary  Natural family planning methods help women and their partners understand how to avoid pregnancy without using medicines or other methods.  A woman learns to recognize her most fertile days. A woman with a 28-day menstrual cycle has about 6 days per month when she can get pregnant. This information is not intended to replace advice given to you by your health care provider. Make sure you discuss any questions you have with your health care provider. Document Revised: 06/20/2019 Document Reviewed: 06/20/2019 Elsevier Patient Education  2021 [employer].

## 2020-07-23 ENCOUNTER — Other Ambulatory Visit: Payer: Self-pay | Admitting: *Deleted

## 2020-07-23 MED ORDER — NORETHINDRONE 0.35 MG PO TABS: 1.0000 | ORAL_TABLET | Freq: Every day | ORAL | 0 refills | Status: DC

## 2020-07-23 NOTE — Telephone Encounter (Signed)
Annual exam scheduled on 09/20/20. Rx sent.

## 2020-09-20 ENCOUNTER — Ambulatory Visit: Payer: BC Managed Care – PPO | Admitting: Nurse Practitioner

## 2020-09-22 ENCOUNTER — Encounter: Payer: Self-pay | Admitting: Nurse Practitioner

## 2020-09-22 ENCOUNTER — Ambulatory Visit (INDEPENDENT_AMBULATORY_CARE_PROVIDER_SITE_OTHER): Payer: BC Managed Care – PPO | Admitting: Nurse Practitioner

## 2020-09-22 ENCOUNTER — Other Ambulatory Visit: Payer: Self-pay

## 2020-09-22 VITALS — BP 124/74 | Ht 62.0 in | Wt 213.0 lb

## 2020-09-22 DIAGNOSIS — Z01419 Encounter for gynecological examination (general) (routine) without abnormal findings: Secondary | ICD-10-CM

## 2020-09-22 NOTE — Progress Notes (Signed)
   Leslie Guerrero 22-Oct-1990 [id number]   History:  30 y.o. G0 presents for annual exam without GYN complaints. Stopped POPs recently with plans to try to conceive. Normal pap history. Migraines with aura managed by neuro, stopped Aimovig injections due to plans for pregnancy. Migraines have been manageable.  Had a fatty mass removed in left axillary and says she has some soreness in that area during her menses.   Gynecologic History Patient's last menstrual period was 08/24/2020. Period Cycle (Days): 28 Period Duration (Days): 6 Period Pattern: Regular Menstrual Flow: Moderate, Light Dysmenorrhea: (!) Mild Dysmenorrhea Symptoms: Cramping Contraception: none  Health Maintenance Last Pap: 05/22/2018. Results were: normal Last mammogram: 2018. Results were: normal Last colonoscopy: Not indicated Last Dexa: Not indicated  Past medical history, past surgical history, family history and social history were all reviewed and documented in the EPIC chart. Married. Engineer, manufacturing systems. Husband is Engineer, technical sales for [employer].   ROS:  A ROS was performed and pertinent positives and negatives are included.  Exam:  Vitals:   09/22/20 1146  BP: 124/74  Weight: 213 lb (96.6 kg)  Height: '5\' 2"'$  (1.575 m)    Body mass index is 38.96 kg/m.  General appearance:  Normal Thyroid:  Symmetrical, normal in size, without palpable masses or nodularity. Respiratory  Auscultation:  Clear without wheezing or rhonchi Cardiovascular  Auscultation:  Regular rate, without rubs, murmurs or gallops  Edema/varicosities:  Not grossly evident Abdominal  Soft,nontender, without masses, guarding or rebound.  Liver/spleen:  No organomegaly noted  Hernia:  None appreciated  Skin  Inspection:  Grossly normal   Breasts: Examined lying and sitting.   Right: Without masses, retractions, discharge or axillary adenopathy.   Left: Fatty mass in left axillary. Without retractions, discharge or axillary  adenopathy. Gentitourinary   Inguinal/mons:  Normal without inguinal adenopathy  External genitalia:  Normal  BUS/Urethra/Skene's glands:  Normal  Vagina:  Normal  Cervix:  Normal  Uterus:  Difficult to palpate due to body habitus but no gross masses or tenderness  Adnexa/parametria:     Rt: Without masses or tenderness.   Lt: Without masses or tenderness.  Anus and perineum: Normal   Assessment/Plan:  30 y.o. G0 for annual exam.   Well female exam with routine gynecological exam - Plan: CBC with Differential/Platelet, Comprehensive metabolic panel. Education provided on SBEs, importance of preventative screenings, current guidelines, high calcium diet, regular exercise, and multivitamin daily.   Screening for cervical cancer - Normal Pap history.  Will repeat at 3-year interval per guidelines.  Follow up in 1 year for annual       [place], 12:08 PM 09/22/2020

## 2020-09-23 LAB — CBC WITH DIFFERENTIAL/PLATELET
Absolute Monocytes: 808 cells/uL (ref 200–950)
Basophils Absolute: 43 cells/uL (ref 0–200)
Basophils Relative: 0.5 %
Eosinophils Absolute: 69 cells/uL (ref 15–500)
Eosinophils Relative: 0.8 %
HCT: 40.8 % (ref 35.0–45.0)
Hemoglobin: 13.5 g/dL (ref 11.7–15.5)
Lymphs Abs: 1402 cells/uL (ref 850–3900)
MCH: 29.8 pg (ref 27.0–33.0)
MCHC: 33.1 g/dL (ref 32.0–36.0)
MCV: 90.1 fL (ref 80.0–100.0)
MPV: 8.9 fL (ref 7.5–12.5)
Monocytes Relative: 9.4 %
Neutro Abs: 6278 cells/uL (ref 1500–7800)
Neutrophils Relative %: 73 %
Platelets: 486 10*3/uL — ABNORMAL HIGH (ref 140–400)
RBC: 4.53 10*6/uL (ref 3.80–5.10)
RDW: 12.8 % (ref 11.0–15.0)
Total Lymphocyte: 16.3 %
WBC: 8.6 10*3/uL (ref 3.8–10.8)

## 2020-09-23 LAB — COMPREHENSIVE METABOLIC PANEL
AG Ratio: 1.4 (calc) (ref 1.0–2.5)
ALT: 34 U/L — ABNORMAL HIGH (ref 6–29)
AST: 21 U/L (ref 10–30)
Albumin: 4.2 g/dL (ref 3.6–5.1)
Alkaline phosphatase (APISO): 70 U/L (ref 31–125)
BUN: 9 mg/dL (ref 7–25)
CO2: 25 mmol/L (ref 20–32)
Calcium: 9.1 mg/dL (ref 8.6–10.2)
Chloride: 103 mmol/L (ref 98–110)
Creat: 0.67 mg/dL (ref 0.50–0.97)
Globulin: 2.9 g/dL (calc) (ref 1.9–3.7)
Glucose, Bld: 76 mg/dL (ref 65–99)
Potassium: 4 mmol/L (ref 3.5–5.3)
Sodium: 136 mmol/L (ref 135–146)
Total Bilirubin: 0.4 mg/dL (ref 0.2–1.2)
Total Protein: 7.1 g/dL (ref 6.1–8.1)

## 2020-10-04 ENCOUNTER — Ambulatory Visit (HOSPITAL_COMMUNITY): Payer: BC Managed Care – PPO

## 2020-10-16 ENCOUNTER — Other Ambulatory Visit: Payer: Self-pay

## 2020-10-16 ENCOUNTER — Emergency Department (HOSPITAL_COMMUNITY): Payer: BC Managed Care – PPO

## 2020-10-16 ENCOUNTER — Encounter (HOSPITAL_COMMUNITY): Payer: Self-pay

## 2020-10-16 ENCOUNTER — Ambulatory Visit (HOSPITAL_COMMUNITY)
Admission: EM | Admit: 2020-10-16 | Discharge: 2020-10-17 | Disposition: A | Discharge status: Home / Self Care | Payer: BC Managed Care – PPO | Attending: Obstetrics & Gynecology | Admitting: Obstetrics & Gynecology

## 2020-10-16 ENCOUNTER — Inpatient Hospital Stay (HOSPITAL_COMMUNITY): Payer: BC Managed Care – PPO

## 2020-10-16 DIAGNOSIS — Z91013 Allergy to seafood: Secondary | ICD-10-CM | POA: Insufficient documentation

## 2020-10-16 DIAGNOSIS — O99411 Diseases of the circulatory system complicating pregnancy, first trimester: Secondary | ICD-10-CM | POA: Insufficient documentation

## 2020-10-16 DIAGNOSIS — Z20822 Contact with and (suspected) exposure to covid-19: Secondary | ICD-10-CM | POA: Diagnosis not present

## 2020-10-16 DIAGNOSIS — O00101 Right tubal pregnancy without intrauterine pregnancy: Secondary | ICD-10-CM | POA: Diagnosis not present

## 2020-10-16 DIAGNOSIS — B349 Viral infection, unspecified: Secondary | ICD-10-CM

## 2020-10-16 DIAGNOSIS — Z9889 Other specified postprocedural states: Secondary | ICD-10-CM

## 2020-10-16 DIAGNOSIS — Z3A01 Less than 8 weeks gestation of pregnancy: Secondary | ICD-10-CM | POA: Diagnosis not present

## 2020-10-16 DIAGNOSIS — O3680X0 Pregnancy with inconclusive fetal viability, not applicable or unspecified: Secondary | ICD-10-CM

## 2020-10-16 DIAGNOSIS — Z3490 Encounter for supervision of normal pregnancy, unspecified, unspecified trimester: Secondary | ICD-10-CM

## 2020-10-16 DIAGNOSIS — Z91048 Other nonmedicinal substance allergy status: Secondary | ICD-10-CM | POA: Diagnosis not present

## 2020-10-16 DIAGNOSIS — O99351 Diseases of the nervous system complicating pregnancy, first trimester: Secondary | ICD-10-CM | POA: Insufficient documentation

## 2020-10-16 DIAGNOSIS — G43909 Migraine, unspecified, not intractable, without status migrainosus: Secondary | ICD-10-CM | POA: Insufficient documentation

## 2020-10-16 DIAGNOSIS — I498 Other specified cardiac arrhythmias: Secondary | ICD-10-CM | POA: Insufficient documentation

## 2020-10-16 DIAGNOSIS — R1010 Upper abdominal pain, unspecified: Secondary | ICD-10-CM

## 2020-10-16 DIAGNOSIS — O081 Delayed or excessive hemorrhage following ectopic and molar pregnancy: Secondary | ICD-10-CM | POA: Diagnosis not present

## 2020-10-16 DIAGNOSIS — R1031 Right lower quadrant pain: Secondary | ICD-10-CM

## 2020-10-16 DIAGNOSIS — Z888 Allergy status to other drugs, medicaments and biological substances status: Secondary | ICD-10-CM | POA: Diagnosis not present

## 2020-10-16 DIAGNOSIS — O99011 Anemia complicating pregnancy, first trimester: Secondary | ICD-10-CM | POA: Insufficient documentation

## 2020-10-16 LAB — CBC WITH DIFFERENTIAL/PLATELET
Abs Immature Granulocytes: 0.09 10*3/uL — ABNORMAL HIGH (ref 0.00–0.07)
Basophils Absolute: 0 10*3/uL (ref 0.0–0.1)
Basophils Relative: 0 %
Eosinophils Absolute: 0 10*3/uL (ref 0.0–0.5)
Eosinophils Relative: 0 %
HCT: 38.4 % (ref 36.0–46.0)
Hemoglobin: 13.1 g/dL (ref 12.0–15.0)
Immature Granulocytes: 0 %
Lymphocytes Relative: 6 %
Lymphs Abs: 1.1 10*3/uL (ref 0.7–4.0)
MCH: 31 pg (ref 26.0–34.0)
MCHC: 34.1 g/dL (ref 30.0–36.0)
MCV: 90.8 fL (ref 80.0–100.0)
Monocytes Absolute: 0.6 10*3/uL (ref 0.1–1.0)
Monocytes Relative: 3 %
Neutro Abs: 18.2 10*3/uL — ABNORMAL HIGH (ref 1.7–7.7)
Neutrophils Relative %: 91 %
Platelets: 544 10*3/uL — ABNORMAL HIGH (ref 150–400)
RBC: 4.23 MIL/uL (ref 3.87–5.11)
RDW: 13.5 % (ref 11.5–15.5)
WBC: 20.1 10*3/uL — ABNORMAL HIGH (ref 4.0–10.5)
nRBC: 0 % (ref 0.0–0.2)

## 2020-10-16 LAB — COMPREHENSIVE METABOLIC PANEL
ALT: 28 U/L (ref 0–44)
AST: 25 U/L (ref 15–41)
Albumin: 3.1 g/dL — ABNORMAL LOW (ref 3.5–5.0)
Alkaline Phosphatase: 60 U/L (ref 38–126)
Anion gap: 10 (ref 5–15)
BUN: 11 mg/dL (ref 6–20)
CO2: 19 mmol/L — ABNORMAL LOW (ref 22–32)
Calcium: 8.5 mg/dL — ABNORMAL LOW (ref 8.9–10.3)
Chloride: 108 mmol/L (ref 98–111)
Creatinine, Ser: 0.72 mg/dL (ref 0.44–1.00)
GFR, Estimated: >60 mL/min (ref >60)
Glucose, Bld: 96 mg/dL (ref 70–99)
Potassium: 3.2 mmol/L — ABNORMAL LOW (ref 3.5–5.1)
Sodium: 137 mmol/L (ref 135–145)
Total Bilirubin: 0.7 mg/dL (ref 0.3–1.2)
Total Protein: 6.2 g/dL — ABNORMAL LOW (ref 6.5–8.1)

## 2020-10-16 LAB — I-STAT BETA HCG BLOOD, ED (MC, WL, AP ONLY): I-stat hCG, quantitative: >2000 m[IU]/mL — ABNORMAL HIGH (ref <5)

## 2020-10-16 LAB — HCG, QUANTITATIVE, PREGNANCY: hCG, Beta Chain, Quant, S: 36057 m[IU]/mL — ABNORMAL HIGH (ref <5)

## 2020-10-16 LAB — LIPASE, BLOOD: Lipase: 23 U/L (ref 11–51)

## 2020-10-16 MED ORDER — HYDROMORPHONE HCL 1 MG/ML IJ SOLN
1.0000 mg | Freq: Once | INTRAMUSCULAR | Status: AC
  Administered 2020-10-16: 1 mg via INTRAVENOUS
  Filled 2020-10-16: qty 1

## 2020-10-16 MED ORDER — SODIUM CHLORIDE 0.9 % IV SOLN: INTRAVENOUS | Status: DC

## 2020-10-16 MED ORDER — SODIUM CHLORIDE 0.9% IV SOLUTION: Freq: Once | INTRAVENOUS | Status: DC

## 2020-10-16 MED ORDER — ONDANSETRON HCL 4 MG/2ML IJ SOLN
4.0000 mg | Freq: Once | INTRAMUSCULAR | Status: AC
  Administered 2020-10-16: 4 mg via INTRAVENOUS
  Filled 2020-10-16: qty 2

## 2020-10-16 MED ORDER — SODIUM CHLORIDE 0.9 % IV BOLUS
1000.0000 mL | Freq: Once | INTRAVENOUS | Status: AC
  Administered 2020-10-16: 1000 mL via INTRAVENOUS

## 2020-10-16 NOTE — ED Triage Notes (Signed)
Patient states she started to feel hot and flushed, then vomited. He spouse call 911. Patient had a syncopal episode while sitting down.Patient is alert and oriented.

## 2020-10-16 NOTE — MAU Note (Signed)
..  Leslie Guerrero is a 30 y.o. at Unknown here in MAU reporting: pain that began on her lower abdomen that shoots up all the way to her shoulders, began today around 11:30 am. Denies vaginal bleeding.  LMP: beginning of September

## 2020-10-16 NOTE — ED Provider Notes (Signed)
Care assumed from Dr. Rogene Houston at shift change. Please see his note for further details.  In short, patient with history of POTS here for syncopal episode and abdominal pain. Found to be about [redacted] weeks pregnant according to hcg. Pain located in RUQ, [place] of same is negative. Patient cleared for syncopal episode, will be sent to MAU for further workup of continuing abdominal pain. Physical Exam  BP 135/68   Pulse (!) 117   Temp 98.4 F (36.9 C) (Oral)   Resp (!) 21   Ht 5\' 2"  (1.575 m)   Wt 96.6 kg   LMP 09/25/2020 (Exact Date)   SpO2 100%   BMI 38.96 kg/m    MDM    Patient discussed with MAU APP Elmyra Ricks, who agreed to accept patient for transfer.       Nestor Lewandowsky 10/16/20 2306    Fredia Sorrow, MD 10/25/20 1315

## 2020-10-16 NOTE — ED Notes (Signed)
Pt transported to US

## 2020-10-16 NOTE — ED Provider Notes (Addendum)
[facility] Provider Note   CSN: [id number] Arrival date & time: 10/16/20  1332     History Chief Complaint  Patient presents with   Loss of Consciousness   HX of POTS    Leslie Guerrero is a 30 y.o. female.  Patient has a history of POTS.  And a history of migraines.  Patient felt fine yesterday.  Of at around noon time she started to feel very bad.  She got crampy abdominal pain in the upper part of the abdomen she felt hot Flushed vomited had a syncopal episode while sitting down.  Patient states she still feels clammy.  Not really having body aches still having abdominal pain mostly upper quadrant mostly right upper quadrant says it that sort of radiates to her left shoulder area.  Never had pain like this before.  No vaginal bleeding no vaginal discharge.  Patient thinks she is dehydrated but her mucous membranes are moist.  No diarrhea.  Patient denies any upper respiratory symptoms.  Patient has been trying to get pregnant.      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   Laceration of right upper arm 09/16/2017    Past Surgical History:  Procedure Laterality Date   Fatty mass removed     Nexplanon insertion  04/25/2016     OB History     Gravida  0   Para      Term      Preterm      AB      Living         SAB      IAB      Ectopic      Multiple      Live Births              Family History  Problem Relation Age of Onset   Hypertension Mother    Diabetes Mother    Fibromyalgia Mother    Migraines Mother    Hypertension Father    Heart attack Father    Diabetes Paternal Uncle    Breast cancer Paternal Grandmother    Diabetes Paternal Grandfather    Stroke Paternal  Grandfather     Social History   Tobacco Use   Smoking status: Never   Smokeless tobacco: Never  Vaping Use   Vaping Use: Never used  Substance Use Topics   Alcohol use: Yes    Alcohol/week: 1.0 standard drink    Types: 1 Standard drinks or equivalent per week    Comment: socially   Drug use: No    Home Medications Prior to Admission medications   Medication Sig Start Date End Date Taking? Authorizing Provider  cyclobenzaprine (FLEXERIL) 10 MG tablet Take 10 mg by mouth 3 (three) times daily as needed for muscle spasms.   Yes [provider]  EXCEDRIN MIGRAINE [phone] MG tablet Take 1-2 tablets by mouth every 6 (six) hours as needed for headache or migraine.   Yes [provider]  acetaminophen (TYLENOL) 500 MG tablet Take 2 tablets (1,000 mg total) by mouth every 6 (six) hours as needed for mild pain, moderate pain or headache. Patient not taking: Reported on 10/16/2020 09/17/17   Leslie Course, MD  Allergies    Fluoxetine, Lavender oil, Shellfish-derived products, and Nickel  Review of Systems   Review of Systems  Constitutional:  Positive for diaphoresis. Negative for chills and fever.  HENT:  Negative for ear pain and sore throat.   Eyes:  Negative for pain and visual disturbance.  Respiratory:  Negative for cough and shortness of breath.   Cardiovascular:  Negative for chest pain and palpitations.  Gastrointestinal:  Positive for abdominal pain, nausea and vomiting.  Genitourinary:  Negative for dysuria and hematuria.  Musculoskeletal:  Negative for arthralgias and back pain.  Skin:  Negative for color change and rash.  Neurological:  Positive for syncope. Negative for seizures.  All other systems reviewed and are negative.  Physical Exam Updated Vital Signs BP 108/75   Pulse (!) 109   Temp 98.4 F (36.9 C) (Oral)   Resp (!) 25   Ht 1.575 m (5\' 2" )   Wt 96.6 kg   LMP 09/25/2020 (Exact Date)   SpO2 100%   BMI 38.96 kg/m   Physical  Exam Vitals and nursing note reviewed.  Constitutional:      General: She is not in acute distress.    Appearance: She is well-developed. She is ill-appearing.  HENT:     Head: Normocephalic and atraumatic.  Eyes:     Extraocular Movements: Extraocular movements intact.     Conjunctiva/sclera: Conjunctivae normal.     Pupils: Pupils are equal, round, and reactive to light.  Cardiovascular:     Rate and Rhythm: Normal rate and regular rhythm.     Heart sounds: No murmur heard. Pulmonary:     Effort: Pulmonary effort is normal. No respiratory distress.     Breath sounds: Normal breath sounds.  Abdominal:     Palpations: Abdomen is soft.     Tenderness: There is abdominal tenderness.     Comments: Patient with tenderness right upper quadrant.  No tenderness in any other quadrants.  Musculoskeletal:        General: No swelling. Normal range of motion.     Cervical back: Neck supple.  Skin:    General: Skin is warm and dry.     Capillary Refill: Capillary refill takes less than 2 seconds.  Neurological:     General: No focal deficit present.     Mental Status: She is alert and oriented to person, place, and time.     Cranial Nerves: No cranial nerve deficit.     Sensory: No sensory deficit.     Motor: No weakness.    ED Results / Procedures / Treatments   Labs (all labs ordered are listed, but only abnormal results are displayed) Labs Reviewed  COMPREHENSIVE METABOLIC PANEL - Abnormal; Notable for the following components:      Result Value   Potassium 3.2 (*)    CO2 19 (*)    Calcium 8.5 (*)    Total Protein 6.2 (*)    Albumin 3.1 (*)    All other components within normal limits  CBC WITH DIFFERENTIAL/PLATELET - Abnormal; Notable for the following components:   WBC 20.1 (*)    Platelets 544 (*)    Neutro Abs 18.2 (*)    Abs Immature Granulocytes 0.09 (*)    All other components within normal limits  I-STAT BETA HCG BLOOD, ED ([facility], [facility], [facility] ONLY) - Abnormal; Notable for  the following components:   I-stat hCG, quantitative >2,000.0 (*)    All other components within normal limits  LIPASE, BLOOD  CBC WITH DIFFERENTIAL/PLATELET  URINALYSIS, ROUTINE W REFLEX MICROSCOPIC  HCG, QUANTITATIVE, PREGNANCY    EKG EKG Interpretation  Date/Time:  Saturday October 16 2020 15:37:05 EDT Ventricular Rate:  127 PR Interval:  123 QRS Duration: 72 QT Interval:  306 QTC Calculation: 445 R Axis:   69 Text Interpretation: Sinus tachycardia Ventricular premature complex Aberrant complex Confirmed by Fredia Sorrow [phone]) on 10/16/2020 4:17:19 PM  Radiology No results found.  Procedures Procedures   Medications Ordered in ED Medications  0.9 %  sodium chloride infusion (has no administration in time range)  sodium chloride 0.9 % bolus 1,000 mL (has no administration in time range)  ondansetron (ZOFRAN) injection 4 mg (4 mg Intravenous Given 10/16/20 1941)  HYDROmorphone (DILAUDID) injection 1 mg (1 mg Intravenous Given 10/16/20 1953)    ED Guerrero  I have reviewed the triage vital signs and the nursing notes.  Pertinent labs & imaging results that were available during my care of the patient were reviewed by me and considered in my medical decision making (see chart for details).    MDM Rules/Calculators/A&P                           Clinically concern for cholelithiasis or prolonged biliary colic.  Patient's pregnancy test is positive.  Does have a white count of 20,000.  Otherwise labs without any significant abnormalities.  Have sent a quantitative pregnancy test just as a baseline marker for her.  She getting an ultrasound of her abdomen for further evaluation.  But LFTs otherwise have no significant abnormality.  No concerns immediately related to the pregnancy.  If the ultrasound does not show gallstones then getting a urinalysis on her will be important because of the high white count.  Disposition will be based on the ultrasound.  Patient given pain  medicine to try to settle down the right upper quadrant pain.  Ultrasound shows no evidence or any problems with the gallbladder.  Think that this may be viral.  Think there is a component of anxiety as well.  Patient's quantitative hCG is elevated at 36,000 so she is truly pregnant.  So this limits treatments and some of the interventions.  I do not really think this is a pulmonary embolus she is really tender right upper quadrant area her lipase is normal.  Her oxygen sats are normal.  Patient is go to get a liter of fluid and will be discharged home follow-up with her OB and her primary care doctor and return for any new or worse symptoms.   Final Clinical Impression(s) / ED Diagnoses Final diagnoses:  Pain of upper abdomen    Rx / DC Orders ED Discharge Orders     None        Fredia Sorrow, MD 10/16/20 2054    Fredia Sorrow, MD 10/16/20 2200

## 2020-10-16 NOTE — ED Notes (Signed)
Called into room due to patient wanting bed laid back.  Patient is sweating, states she feels clammy. Cool wet washcloth placed on forehead. Bed in lowed position, brakes set and rail up.

## 2020-10-16 NOTE — Discharge Instructions (Addendum)
If your OB/GYN a call on Monday and let them know that you are pregnancy test is positive.  Follow-up with your regular doctor as well.  Return for any new or worse symptoms.  Work-up here clinically felt that there would be a gallbladder problem but it is normal on the ultrasound.  Did have a leukocytosis but no explanation for that.  Some limitations on the work-up due to the early pregnancy.  Feel clinically it is okay to go home and see how this plays out.

## 2020-10-16 NOTE — ED Notes (Signed)
Patient c/o 8 out of 10 abdominal pain. MD made aware.

## 2020-10-17 ENCOUNTER — Inpatient Hospital Stay (HOSPITAL_COMMUNITY): Payer: BC Managed Care – PPO | Admitting: Anesthesiology

## 2020-10-17 ENCOUNTER — Encounter (HOSPITAL_COMMUNITY): Payer: Self-pay | Admitting: Obstetrics and Gynecology

## 2020-10-17 ENCOUNTER — Encounter (HOSPITAL_COMMUNITY)
Admission: EM | Disposition: A | Discharge status: Home / Self Care | Payer: Self-pay | Source: Home / Self Care | Attending: Obstetrics & Gynecology

## 2020-10-17 DIAGNOSIS — Z9889 Other specified postprocedural states: Secondary | ICD-10-CM

## 2020-10-17 HISTORY — PX: LAPAROSCOPIC UNILATERAL SALPINGECTOMY: SHX5934

## 2020-10-17 HISTORY — PX: DIAGNOSTIC LAPAROSCOPY WITH REMOVAL OF ECTOPIC PREGNANCY: SHX6449

## 2020-10-17 LAB — RESP PANEL BY RT-PCR (FLU A&B, COVID) ARPGX2
Influenza A by PCR: NEGATIVE
Influenza B by PCR: NEGATIVE
SARS Coronavirus 2 by RT PCR: NEGATIVE

## 2020-10-17 LAB — POCT I-STAT EG7
Acid-base deficit: 5 mmol/L — ABNORMAL HIGH (ref 0.0–2.0)
Acid-base deficit: 4 mmol/L — ABNORMAL HIGH (ref 0.0–2.0)
Bicarbonate: 20.2 mmol/L (ref 20.0–28.0)
Bicarbonate: 21.7 mmol/L (ref 20.0–28.0)
Calcium, Ion: 1.18 mmol/L (ref 1.15–1.40)
Calcium, Ion: 1.14 mmol/L — ABNORMAL LOW (ref 1.15–1.40)
HCT: 27 % — ABNORMAL LOW (ref 36.0–46.0)
HCT: 23 % — ABNORMAL LOW (ref 36.0–46.0)
Hemoglobin: 9.2 g/dL — ABNORMAL LOW (ref 12.0–15.0)
Hemoglobin: 7.8 g/dL — ABNORMAL LOW (ref 12.0–15.0)
O2 Saturation: 94 %
O2 Saturation: 92 %
Potassium: 4.4 mmol/L (ref 3.5–5.1)
Potassium: 4.3 mmol/L (ref 3.5–5.1)
Sodium: 137 mmol/L (ref 135–145)
Sodium: 136 mmol/L (ref 135–145)
TCO2: 21 mmol/L — ABNORMAL LOW (ref 22–32)
TCO2: 23 mmol/L (ref 22–32)
pCO2, Ven: 39.5 mmHg — ABNORMAL LOW (ref 44.0–60.0)
pCO2, Ven: 42.1 mmHg — ABNORMAL LOW (ref 44.0–60.0)
pH, Ven: 7.317 (ref 7.250–7.430)
pH, Ven: 7.32 (ref 7.250–7.430)
pO2, Ven: 76 mmHg — ABNORMAL HIGH (ref 32.0–45.0)
pO2, Ven: 70 mmHg — ABNORMAL HIGH (ref 32.0–45.0)

## 2020-10-17 LAB — CBC
HCT: 24.5 % — ABNORMAL LOW (ref 36.0–46.0)
HCT: 30.1 % — ABNORMAL LOW (ref 36.0–46.0)
Hemoglobin: 8.4 g/dL — ABNORMAL LOW (ref 12.0–15.0)
Hemoglobin: 9.7 g/dL — ABNORMAL LOW (ref 12.0–15.0)
MCH: 30.4 pg (ref 26.0–34.0)
MCH: 31.1 pg (ref 26.0–34.0)
MCHC: 32.2 g/dL (ref 30.0–36.0)
MCHC: 34.3 g/dL (ref 30.0–36.0)
MCV: 90.7 fL (ref 80.0–100.0)
MCV: 94.4 fL (ref 80.0–100.0)
Platelets: 371 10*3/uL (ref 150–400)
Platelets: 449 10*3/uL — ABNORMAL HIGH (ref 150–400)
RBC: 2.7 MIL/uL — ABNORMAL LOW (ref 3.87–5.11)
RBC: 3.19 MIL/uL — ABNORMAL LOW (ref 3.87–5.11)
RDW: 13.6 % (ref 11.5–15.5)
RDW: 13.7 % (ref 11.5–15.5)
WBC: 17 10*3/uL — ABNORMAL HIGH (ref 4.0–10.5)
WBC: 17.6 10*3/uL — ABNORMAL HIGH (ref 4.0–10.5)
nRBC: 0 % (ref 0.0–0.2)
nRBC: 0 % (ref 0.0–0.2)

## 2020-10-17 LAB — ABO/RH: ABO/RH(D): AB NEG

## 2020-10-17 LAB — PREPARE RBC (CROSSMATCH)

## 2020-10-17 SURGERY — LAPAROSCOPY, WITH ECTOPIC PREGNANCY SURGICAL TREATMENT
Anesthesia: General | Laterality: Right

## 2020-10-17 MED ORDER — RHO D IMMUNE GLOBULIN 1500 UNIT/2ML IJ SOSY
300.0000 ug | PREFILLED_SYRINGE | Freq: Once | INTRAMUSCULAR | Status: AC
  Administered 2020-10-17: 300 ug via INTRAVENOUS
  Filled 2020-10-17: qty 2

## 2020-10-17 MED ORDER — RHO D IMMUNE GLOBULIN 1500 UNIT/2ML IJ SOSY
300.0000 ug | PREFILLED_SYRINGE | Freq: Once | INTRAMUSCULAR | Status: DC
  Filled 2020-10-17: qty 2

## 2020-10-17 MED ORDER — HYDROMORPHONE HCL 1 MG/ML IJ SOLN
INTRAMUSCULAR | Status: AC
  Filled 2020-10-17: qty 1

## 2020-10-17 MED ORDER — SODIUM CHLORIDE 0.9 % IR SOLN
Status: DC | PRN
  Administered 2020-10-17: 3000 mL

## 2020-10-17 MED ORDER — BUPIVACAINE HCL (PF) 0.25 % IJ SOLN
INTRAMUSCULAR | Status: AC
  Filled 2020-10-17: qty 30

## 2020-10-17 MED ORDER — FENTANYL CITRATE (PF) 250 MCG/5ML IJ SOLN
INTRAMUSCULAR | Status: AC
  Filled 2020-10-17: qty 5

## 2020-10-17 MED ORDER — MEPERIDINE HCL 25 MG/ML IJ SOLN
INTRAMUSCULAR | Status: AC
  Filled 2020-10-17: qty 1

## 2020-10-17 MED ORDER — PHENYLEPHRINE HCL-NACL 20-0.9 MG/250ML-% IV SOLN
INTRAVENOUS | Status: DC | PRN
  Administered 2020-10-17: 25 ug/min via INTRAVENOUS

## 2020-10-17 MED ORDER — DEXAMETHASONE SODIUM PHOSPHATE 10 MG/ML IJ SOLN
INTRAMUSCULAR | Status: DC | PRN
  Administered 2020-10-17: 10 mg via INTRAVENOUS

## 2020-10-17 MED ORDER — BUPIVACAINE HCL (PF) 0.25 % IJ SOLN
INTRAMUSCULAR | Status: DC | PRN
  Administered 2020-10-17: 10 mL

## 2020-10-17 MED ORDER — PROPOFOL 10 MG/ML IV BOLUS
INTRAVENOUS | Status: AC
  Filled 2020-10-17: qty 20

## 2020-10-17 MED ORDER — SCOPOLAMINE 1 MG/3DAYS TD PT72
MEDICATED_PATCH | TRANSDERMAL | Status: DC | PRN
  Administered 2020-10-17: 1 via TRANSDERMAL

## 2020-10-17 MED ORDER — ROCURONIUM BROMIDE 10 MG/ML (PF) SYRINGE
PREFILLED_SYRINGE | INTRAVENOUS | Status: DC | PRN
Start: 2020-10-17 — End: 2020-10-17
  Administered 2020-10-17: 10 mg via INTRAVENOUS
  Administered 2020-10-17: 40 mg via INTRAVENOUS

## 2020-10-17 MED ORDER — SUGAMMADEX SODIUM 200 MG/2ML IV SOLN
INTRAVENOUS | Status: DC | PRN
  Administered 2020-10-17: 200 mg via INTRAVENOUS

## 2020-10-17 MED ORDER — LIDOCAINE 2% (20 MG/ML) 5 ML SYRINGE
INTRAMUSCULAR | Status: DC | PRN
  Administered 2020-10-17: 20 mg via INTRAVENOUS

## 2020-10-17 MED ORDER — PROPOFOL 10 MG/ML IV BOLUS
INTRAVENOUS | Status: DC | PRN
  Administered 2020-10-17 (×2): 100 mg via INTRAVENOUS

## 2020-10-17 MED ORDER — SODIUM CHLORIDE 0.9% IV SOLUTION: Freq: Once | INTRAVENOUS | Status: DC

## 2020-10-17 MED ORDER — FENTANYL CITRATE (PF) 100 MCG/2ML IJ SOLN
INTRAMUSCULAR | Status: DC | PRN
  Administered 2020-10-17 (×5): 50 ug via INTRAVENOUS

## 2020-10-17 MED ORDER — MEPERIDINE HCL 25 MG/ML IJ SOLN
6.2500 mg | INTRAMUSCULAR | Status: DC | PRN
  Administered 2020-10-17: 12.5 mg via INTRAVENOUS

## 2020-10-17 MED ORDER — OXYCODONE-ACETAMINOPHEN 5-325 MG PO TABS
2.0000 | ORAL_TABLET | ORAL | Status: DC | PRN
Start: 2020-10-17 — End: 2020-10-17

## 2020-10-17 MED ORDER — MIDAZOLAM HCL 2 MG/2ML IJ SOLN: 0.5000 mg | Freq: Once | INTRAMUSCULAR | Status: DC | PRN

## 2020-10-17 MED ORDER — LACTATED RINGERS IV SOLN: INTRAVENOUS | Status: DC

## 2020-10-17 MED ORDER — MIDAZOLAM HCL 2 MG/2ML IJ SOLN
INTRAMUSCULAR | Status: AC
  Filled 2020-10-17: qty 2

## 2020-10-17 MED ORDER — HYDROCODONE-ACETAMINOPHEN 5-325 MG PO TABS
1.0000 | ORAL_TABLET | ORAL | Status: DC | PRN
  Administered 2020-10-17 (×2): 1 via ORAL
  Filled 2020-10-17 (×2): qty 1

## 2020-10-17 MED ORDER — OXYCODONE-ACETAMINOPHEN 7.5-325 MG PO TABS: 1.0000 | ORAL_TABLET | Freq: Four times a day (QID) | ORAL | 0 refills | Status: DC | PRN

## 2020-10-17 MED ORDER — HYDROMORPHONE HCL 1 MG/ML IJ SOLN
0.2500 mg | INTRAMUSCULAR | Status: DC | PRN
  Administered 2020-10-17: 0.5 mg via INTRAVENOUS

## 2020-10-17 MED ORDER — PROMETHAZINE HCL 25 MG/ML IJ SOLN: 6.2500 mg | INTRAMUSCULAR | Status: DC | PRN

## 2020-10-17 MED ORDER — SUCCINYLCHOLINE CHLORIDE 200 MG/10ML IV SOSY
PREFILLED_SYRINGE | INTRAVENOUS | Status: DC | PRN
  Administered 2020-10-17: 160 mg via INTRAVENOUS

## 2020-10-17 MED ORDER — MIDAZOLAM HCL 5 MG/5ML IJ SOLN
INTRAMUSCULAR | Status: DC | PRN
  Administered 2020-10-17: .5 mg via INTRAVENOUS

## 2020-10-17 MED ORDER — LACTATED RINGERS IV SOLN: INTRAVENOUS | Status: DC | PRN

## 2020-10-17 MED ORDER — CEFAZOLIN SODIUM-DEXTROSE 2-3 GM-%(50ML) IV SOLR
INTRAVENOUS | Status: DC | PRN
  Administered 2020-10-17: 2 g via INTRAVENOUS

## 2020-10-17 MED ORDER — ACETAMINOPHEN 325 MG PO TABS: 650.0000 mg | ORAL_TABLET | ORAL | Status: DC | PRN

## 2020-10-17 MED ORDER — ONDANSETRON HCL 4 MG/2ML IJ SOLN
INTRAMUSCULAR | Status: DC | PRN
  Administered 2020-10-17: 4 mg via INTRAVENOUS

## 2020-10-17 MED ORDER — OXYCODONE HCL 5 MG/5ML PO SOLN: 5.0000 mg | Freq: Once | ORAL | Status: DC | PRN

## 2020-10-17 MED ORDER — OXYCODONE HCL 5 MG PO TABS
5.0000 mg | ORAL_TABLET | Freq: Once | ORAL | Status: DC | PRN
Start: 2020-10-17 — End: 2020-10-17

## 2020-10-17 SURGICAL SUPPLY — 34 items
CABLE HIGH FREQUENCY MONO STRZ (ELECTRODE) IMPLANT
CATH ROBINSON RED A/P 16FR (CATHETERS) ×3 IMPLANT
DERMABOND ADVANCED (GAUZE/BANDAGES/DRESSINGS) ×1
DERMABOND ADVANCED .7 DNX12 (GAUZE/BANDAGES/DRESSINGS) ×2 IMPLANT
DRSG OPSITE POSTOP 3X4 (GAUZE/BANDAGES/DRESSINGS) ×3 IMPLANT
DURAPREP 26ML APPLICATOR (WOUND CARE) ×3 IMPLANT
GLOVE SURG ENC MOIS LTX SZ6.5 (GLOVE) ×3 IMPLANT
GLOVE SURG UNDER POLY LF SZ7 (GLOVE) ×6 IMPLANT
GOWN STRL REUS W/ TWL LRG LVL3 (GOWN DISPOSABLE) ×6 IMPLANT
GOWN STRL REUS W/TWL LRG LVL3 (GOWN DISPOSABLE) ×3
KIT TURNOVER KIT B (KITS) ×3 IMPLANT
LIGASURE VESSEL 5MM BLUNT TIP (ELECTROSURGICAL) ×3 IMPLANT
MANIPULATOR UTERINE 4.5 ZUMI (MISCELLANEOUS) IMPLANT
PACK LAPAROSCOPY BASIN (CUSTOM PROCEDURE TRAY) ×3 IMPLANT
PACK TRENDGUARD 450 HYBRID PRO (MISCELLANEOUS) ×2 IMPLANT
PENCIL BUTTON HOLSTER BLD 10FT (ELECTRODE) ×3 IMPLANT
POUCH SPECIMEN RETRIEVAL 10MM (ENDOMECHANICALS) ×3 IMPLANT
PROTECTOR NERVE ULNAR (MISCELLANEOUS) ×6 IMPLANT
SEALER TISSUE G2 CVD JAW 35 (ENDOMECHANICALS) IMPLANT
SEALER TISSUE G2 CVD JAW 45CM (ENDOMECHANICALS)
SET IRRIG TUBING LAPAROSCOPIC (IRRIGATION / IRRIGATOR) ×3 IMPLANT
SET TUBE SMOKE EVAC HIGH FLOW (TUBING) ×3 IMPLANT
SHEARS HARMONIC ACE PLUS 36CM (ENDOMECHANICALS) ×3 IMPLANT
SLEEVE ENDOPATH XCEL 5M (ENDOMECHANICALS) ×6 IMPLANT
SUT MNCRL AB 4-0 PS2 18 (SUTURE) ×6 IMPLANT
SUT VICRYL 0 UR6 27IN ABS (SUTURE) ×6 IMPLANT
TOWEL GREEN STERILE FF (TOWEL DISPOSABLE) ×6 IMPLANT
TRAP SPECIMEN MUCUS 40CC (MISCELLANEOUS) ×3 IMPLANT
TRAY FOLEY W/BAG SLVR 14FR (SET/KITS/TRAYS/PACK) ×3 IMPLANT
TRENDGUARD 450 HYBRID PRO PACK (MISCELLANEOUS) ×3
TROCAR XCEL BLUNT TIP 100MML (ENDOMECHANICALS) ×3 IMPLANT
TROCAR XCEL NON-BLD 11X100MML (ENDOMECHANICALS) ×3 IMPLANT
TROCAR XCEL NON-BLD 5MMX100MML (ENDOMECHANICALS) ×3 IMPLANT
WARMER LAPAROSCOPE (MISCELLANEOUS) ×3 IMPLANT

## 2020-10-17 NOTE — MAU Provider Note (Signed)
Chief Complaint: Loss of Consciousness and HX of POTS   Event Date/Time   First Provider Initiated Contact with Patient 10/16/20 2333        SUBJECTIVE HPI: Leslie Guerrero is a 30 y.o. G1P0 at [redacted]w[redacted]d by LMP who presents to maternity admissions reporting severe abdominal pain since earlier today.  HCG done in ED was .36,057   Abdominal US was done which did  not mention pregnancy status.  ROS difficult due to acuity of pain and instability.  Level 5 caveat  Abdominal Pain This is a new problem. The current episode started today. The problem occurs constantly. The problem has been gradually worsening. The pain is located in the generalized abdominal region and RLQ. The pain is severe. The pain is aggravated by movement, certain positions, deep breathing and palpation. The pain is relieved by Nothing. Treatments tried: Dilaudid given here. The treatment provided no relief.   RN Note: Leslie Guerrero is a 30 y.o. at Unknown here in MAU reporting: pain that began on her lower abdomen that shoots up all the way to her shoulders, began today around 11:30 am. Denies vaginal bleeding.  LMP: beginning of September   Past Medical History:  Diagnosis Date   Migraines    POTS (postural orthostatic tachycardia syndrome)    Thrombocytosis 05/2011   platelet count 531,000   Past Surgical History:  Procedure Laterality Date   Fatty mass removed     Nexplanon insertion  04/25/2016   Social History   Socioeconomic History   Marital status: Single    Spouse name: Not on file   Number of children: Not on file   Years of education: Not on file   Highest education level: Not on file  Occupational History   Not on file  Tobacco Use   Smoking status: Never   Smokeless tobacco: Never  Vaping Use   Vaping Use: Never used  Substance and Sexual Activity   Alcohol use: Yes    Alcohol/week: 1.0 standard drink    Types: 1 Standard drinks or equivalent per week    Comment: socially   Drug use: No   Sexual  activity: Yes    Birth control/protection: None    Comment: INTERCOUSRE AGE 54, ,MORE THAN 5 SEXUAL PAJRTNERS  Other Topics Concern   Not on file  Social History Narrative   Not on file   Social Determinants of Health   Financial Resource Strain: Not on file  Food Insecurity: Not on file  Transportation Needs: Not on file  Physical Activity: Not on file  Stress: Not on file  Social Connections: Not on file  Intimate Partner Violence: Not on file   No current facility-administered medications on file prior to encounter.   Current Outpatient Medications on File Prior to Encounter  Medication Sig Dispense Refill   cyclobenzaprine (FLEXERIL) 10 MG tablet Take 10 mg by mouth 3 (three) times daily as needed for muscle spasms.     EXCEDRIN MIGRAINE 250-250-65 MG tablet Take 1-2 tablets by mouth every 6 (six) hours as needed for headache or migraine.     acetaminophen (TYLENOL) 500 MG tablet Take 2 tablets (1,000 mg total) by mouth every 6 (six) hours as needed for mild pain, moderate pain or headache. (Patient not taking: Reported on 10/16/2020) 30 tablet 0   Allergies  Allergen Reactions   Fluoxetine Other (See Comments)    "Gave me a headache and I felt badly"   Lavender Oil Hives   Shellfish-Derived Products Hives, Itching  and Other (See Comments)    Sweating, also (no breathing issues, however)   Nickel Rash    I have reviewed patient's Past Medical Hx, Surgical Hx, Family Hx, Social Hx, medications and allergies.   ROS:  Review of Systems  Unable to perform ROS: Acuity of condition (Acuity of condition)  Gastrointestinal:  Positive for abdominal pain.  Review of Systems  Other systems negative   Physical Exam  Physical Exam Patient Vitals for the past 24 hrs:  BP Temp Temp src Pulse Resp SpO2 Height Weight  10/16/20 2230 135/68 -- -- (!) 117 (!) 21 100 % -- --  10/16/20 2200 126/77 -- -- (!) 114 (!) 21 100 % -- --  10/16/20 2105 131/60 -- -- (!) 114 (!) 27 100 % -- --   10/16/20 1930 108/75 -- -- (!) 109 (!) 25 100 % -- --  10/16/20 1858 103/77 -- -- (!) 126 (!) 23 100 % -- --  10/16/20 1751 116/82 -- -- (!) 128 (!) 21 100 % -- --  10/16/20 1621 102/70 -- -- (!) 133 (!) 22 100 % -- --  10/16/20 1516 (!) 89/60 -- -- (!) 112 16 100 % -- --  10/16/20 1424 104/72 -- -- 87 18 100 % -- --  10/16/20 1347 -- -- -- -- -- -- 5\' 2"  (1.575 m) 96.6 kg  10/16/20 1345 112/80 98.4 F (36.9 C) Oral (!) 110 16 100 % -- --   Constitutional: Well-developed, well-nourished female, diaphoretic, shivering, in severe pain  Cardiovascular: tachycardic Respiratory: normal effort, shallow GI: Abd somewhat tense, distended and exquisitely tender with guarding. Rebound not tested MS: Extremities nontender, no edema, normal ROM Neurologic: Alert and oriented x 4.  GU: Neg CVAT.  PELVIC EXAM: deferred due to acuity of condition and pain. Stat US done at bedside LAB RESULTS Results for orders placed or performed during the hospital encounter of 10/16/20 (from the past 24 hour(s))  CBC     Status: Abnormal   Collection Time: 10/16/20 12:50 AM  Result Value Ref Range   WBC 17.6 (H) 4.0 - 10.5 K/uL   RBC 3.19 (L) 3.87 - 5.11 MIL/uL   Hemoglobin 9.7 (L) 12.0 - 15.0 g/dL   HCT 30.1 (L) 36.0 - 46.0 %   MCV 94.4 80.0 - 100.0 fL   MCH 30.4 26.0 - 34.0 pg   MCHC 32.2 30.0 - 36.0 g/dL   RDW 13.6 11.5 - 15.5 %   Platelets 449 (H) 150 - 400 K/uL   nRBC 0.0 0.0 - 0.2 %  Comprehensive metabolic panel     Status: Abnormal   Collection Time: 10/16/20  1:50 PM  Result Value Ref Range   Sodium 137 135 - 145 mmol/L   Potassium 3.2 (L) 3.5 - 5.1 mmol/L   Chloride 108 98 - 111 mmol/L   CO2 19 (L) 22 - 32 mmol/L   Glucose, Bld 96 70 - 99 mg/dL   BUN 11 6 - 20 mg/dL   Creatinine, Ser 0.72 0.44 - 1.00 mg/dL   Calcium 8.5 (L) 8.9 - 10.3 mg/dL   Total Protein 6.2 (L) 6.5 - 8.1 g/dL   Albumin 3.1 (L) 3.5 - 5.0 g/dL   AST 25 15 - 41 U/L   ALT 28 0 - 44 U/L   Alkaline Phosphatase 60 38 - 126  U/L   Total Bilirubin 0.7 0.3 - 1.2 mg/dL   GFR, Estimated >60 >60 mL/min   Anion gap 10 5 - 15  I-Stat  beta hCG blood, ED     Status: Abnormal   Collection Time: 10/16/20  3:56 PM  Result Value Ref Range   I-stat hCG, quantitative >2,000.0 (H) <5 mIU/mL   Comment 3          CBC with Differential/Platelet     Status: Abnormal   Collection Time: 10/16/20  4:05 PM  Result Value Ref Range   WBC 20.1 (H) 4.0 - 10.5 K/uL   RBC 4.23 3.87 - 5.11 MIL/uL   Hemoglobin 13.1 12.0 - 15.0 g/dL   HCT 38.4 36.0 - 46.0 %   MCV 90.8 80.0 - 100.0 fL   MCH 31.0 26.0 - 34.0 pg   MCHC 34.1 30.0 - 36.0 g/dL   RDW 13.5 11.5 - 15.5 %   Platelets 544 (H) 150 - 400 K/uL   nRBC 0.0 0.0 - 0.2 %   Neutrophils Relative % 91 %   Neutro Abs 18.2 (H) 1.7 - 7.7 K/uL   Lymphocytes Relative 6 %   Lymphs Abs 1.1 0.7 - 4.0 K/uL   Monocytes Relative 3 %   Monocytes Absolute 0.6 0.1 - 1.0 K/uL   Eosinophils Relative 0 %   Eosinophils Absolute 0.0 0.0 - 0.5 K/uL   Basophils Relative 0 %   Basophils Absolute 0.0 0.0 - 0.1 K/uL   Immature Granulocytes 0 %   Abs Immature Granulocytes 0.09 (H) 0.00 - 0.07 K/uL  ABO/Rh     Status: None   Collection Time: 10/16/20  4:05 PM  Result Value Ref Range   ABO/RH(D)      AB NEG Performed at [facility], [address], [place], [place] [address]   Lipase, blood     Status: None   Collection Time: 10/16/20  7:28 PM  Result Value Ref Range   Lipase 23 11 - 51 U/L  hCG, quantitative, pregnancy     Status: Abnormal   Collection Time: 10/16/20  8:06 PM  Result Value Ref Range   hCG, Beta Chain, Quant, S 36,057 (H) <5 mIU/mL  Prepare RBC (crossmatch)     Status: None   Collection Time: 10/17/20 12:22 AM  Result Value Ref Range   Order Confirmation      ORDER PROCESSED BY BLOOD BANK Performed at [facility], [place] [address] [place], [place] [address]      --/--/AB NEG Performed at [facility], [place] [address], [place], [place] [address]  [phone]  [phone])  IMAGING US Abdomen Complete  Result Date: 10/16/2020 CLINICAL DATA:  Upper abdominal pain EXAM: ABDOMEN ULTRASOUND COMPLETE COMPARISON:  CT 09/16/2017 FINDINGS: Gallbladder: No gallstones or wall thickening visualized. No sonographic Murphy sign noted by sonographer. Common bile duct: Diameter: 2.1 mm Liver: No focal lesion identified. Within normal limits in parenchymal echogenicity. Portal vein is patent on color Doppler imaging with normal direction of blood flow towards the liver. IVC: No abnormality visualized. Pancreas: Not visualized due to bowel gas Spleen: Size and appearance within normal limits. Right Kidney: Length: 11.2 cm. Echogenicity within normal limits. No mass or hydronephrosis visualized. Left Kidney: Length: 10.6 cm. Echogenicity within normal limits. No mass or hydronephrosis visualized. Abdominal aorta: Largely obscured by bowel gas. Other findings: Small moderate ascites within the right upper quadrant. Small amount of ascites within the left lower quadrant. IMPRESSION: 1. Negative for gallstones. 2. Pancreas and aorta largely obscured by bowel gas 3. Small moderate ascites in the right upper quadrant of unclear source Electronically Signed   By:  Donavan Foil M.D.   On: 10/16/2020 20:55    Pelvic US report pending (verbal report given by Tech and Radiologist) Complex fluid seen in anterior and posterior culdesac Complex fluid seen in RUQ and LUQ No pregnancy seen in Uterus  MAU Management/MDM: Prior lab results reviewed Prior US reviewed  Initial Hgb is reassuring, but has now dropped from 13.1 to 9.7 HCG is 36,057  [place] here shows complex fluid in culdesac and upper abdomen with nothing in uterus  This pain can represent a normal pregnancy with bleeding, spontaneous abortion or even an ectopic which can be life-threatening.  The process as listed above helps to determine which of these is present.    After results reviewed, this is presumed to be a ruptured ectopic  pregnancy.   OR called so that they could call in the team Type and crossmatch ordered and second IV line was established  Dr Ilda Basset notified initially but then we found that she was a patient of [facility] GYN Dr Dellis Filbert was called and arrived shortly thereafter and took the patient for urgent Laparoscopy  ASSESSMENT Pregnancy at [redacted]w[redacted]d Abdominal pain, worsening No pregnancy seen in uterus despite high HCG level Ruptured ectopic pregnancy  PLAN Patient taken to OR for surgery by Dr Dellis Filbert MD to follow    Hansel Feinstein CNM, MSN Certified Nurse-Midwife 10/17/2020  12:36 AM

## 2020-10-17 NOTE — Anesthesia Postprocedure Evaluation (Signed)
Anesthesia Post Note  Patient: Leslie Guerrero  Procedure(s) Performed: DIAGNOSTIC LAPAROSCOPY WITH REMOVAL OF ECTOPIC PREGNANCY LAPAROSCOPIC RIGHT SALPINGECTOMY, EVACUATION OF HEMOPERITONEUM (Right)     Patient location during evaluation: PACU Anesthesia Type: General Level of consciousness: awake and alert, patient cooperative and oriented Pain management: pain level controlled Vital Signs Assessment: post-procedure vital signs reviewed and stable Respiratory status: spontaneous breathing, nonlabored ventilation, respiratory function stable and patient connected to nasal cannula oxygen Cardiovascular status: blood pressure returned to baseline and stable Postop Assessment: no apparent nausea or vomiting Anesthetic complications: no   No notable events documented.  Last Vitals:  Vitals:   10/17/20 0610 10/17/20 0615  BP:    Pulse:    Resp:    Temp:    SpO2: 95% 99%    Last Pain:  Vitals:   10/17/20 0350  TempSrc:   PainSc: Asleep                 Cathlene Gardella,E. Dayshia Ballinas

## 2020-10-17 NOTE — Anesthesia Procedure Notes (Signed)
Procedure Name: Intubation Date/Time: 10/17/2020 12:58 AM Performed by: Suzy Bouchard, CRNA Pre-anesthesia Checklist: Patient identified, Emergency Drugs available, Suction available, Patient being monitored and Timeout performed Patient Re-evaluated:Patient Re-evaluated prior to induction Oxygen Delivery Method: Circle system utilized Preoxygenation: Pre-oxygenation with 100% oxygen Induction Type: IV induction, Rapid sequence and Cricoid Pressure applied Laryngoscope Size: Glidescope and 3 Grade View: Grade I Tube type: Oral Tube size: 7.0 mm Number of attempts: 1 Airway Equipment and Method: Stylet and Video-laryngoscopy Placement Confirmation: ETT inserted through vocal cords under direct vision, breath sounds checked- equal and bilateral and positive ETCO2 Secured at: 22 cm Tube secured with: Tape Dental Injury: Teeth and Oropharynx as per pre-operative assessment

## 2020-10-17 NOTE — Transfer of Care (Signed)
Immediate Anesthesia Transfer of Care Note  Patient: Leslie Guerrero  Procedure(s) Performed: DIAGNOSTIC LAPAROSCOPY WITH REMOVAL OF ECTOPIC PREGNANCY LAPAROSCOPIC RIGHT SALPINGECTOMY, EVACUATION OF HEMOPERITONEUM (Right)  Patient Location: PACU  Anesthesia Type:General  Level of Consciousness: awake and alert   Airway & Oxygen Therapy: Patient Spontanous Breathing and Patient connected to nasal cannula oxygen  Post-op Assessment: Report given to RN and Post -op Vital signs reviewed and stable  Post vital signs: Reviewed and stable  Last Vitals:  Vitals Value Taken Time  BP 109/89 10/17/20 0305  Temp    Pulse 125 10/17/20 0309  Resp 26 10/17/20 0309  SpO2 100 % 10/17/20 0309  Vitals shown include unvalidated device data.  Last Pain:  Vitals:   10/16/20 2141  TempSrc:   PainSc: 10-Worst pain ever         Complications: No notable events documented.

## 2020-10-17 NOTE — Progress Notes (Signed)
POD #0 at 5+ hours postop LPS Evacuation of hemoperitoneum, Rt Salpingectomy for ectopic pregnancy.  Subjective: Patient reports tolerating PO and no problems voiding.    Objective: I have reviewed patient's vital signs.  vital signs, medications, and labs.  Vitals:   10/17/20 0615 10/17/20 0800  BP:  113/63  Pulse:  100  Resp:  16  Temp:    SpO2: 99% 100%   I/O last 3 completed shifts: In: 1000 [I.V.:1000] Out: 1000 [Urine:500; Blood:500] No intake/output data recorded.  Results for orders placed or performed during the hospital encounter of 10/16/20 (from the past 24 hour(s))  Comprehensive metabolic panel     Status: Abnormal   Collection Time: 10/16/20  1:50 PM  Result Value Ref Range   Sodium 137 135 - 145 mmol/L   Potassium 3.2 (L) 3.5 - 5.1 mmol/L   Chloride 108 98 - 111 mmol/L   CO2 19 (L) 22 - 32 mmol/L   Glucose, Bld 96 70 - 99 mg/dL   BUN 11 6 - 20 mg/dL   Creatinine, Ser 0.72 0.44 - 1.00 mg/dL   Calcium 8.5 (L) 8.9 - 10.3 mg/dL   Total Protein 6.2 (L) 6.5 - 8.1 g/dL   Albumin 3.1 (L) 3.5 - 5.0 g/dL   AST 25 15 - 41 U/L   ALT 28 0 - 44 U/L   Alkaline Phosphatase 60 38 - 126 U/L   Total Bilirubin 0.7 0.3 - 1.2 mg/dL   GFR, Estimated >60 >60 mL/min   Anion gap 10 5 - 15  I-Stat beta hCG blood, ED     Status: Abnormal   Collection Time: 10/16/20  3:56 PM  Result Value Ref Range   I-stat hCG, quantitative >2,000.0 (H) <5 mIU/mL   Comment 3          CBC with Differential/Platelet     Status: Abnormal   Collection Time: 10/16/20  4:05 PM  Result Value Ref Range   WBC 20.1 (H) 4.0 - 10.5 K/uL   RBC 4.23 3.87 - 5.11 MIL/uL   Hemoglobin 13.1 12.0 - 15.0 g/dL   HCT 38.4 36.0 - 46.0 %   MCV 90.8 80.0 - 100.0 fL   MCH 31.0 26.0 - 34.0 pg   MCHC 34.1 30.0 - 36.0 g/dL   RDW 13.5 11.5 - 15.5 %   Platelets 544 (H) 150 - 400 K/uL   nRBC 0.0 0.0 - 0.2 %   Neutrophils Relative % 91 %   Neutro Abs 18.2 (H) 1.7 - 7.7 K/uL   Lymphocytes Relative 6 %   Lymphs Abs 1.1  0.7 - 4.0 K/uL   Monocytes Relative 3 %   Monocytes Absolute 0.6 0.1 - 1.0 K/uL   Eosinophils Relative 0 %   Eosinophils Absolute 0.0 0.0 - 0.5 K/uL   Basophils Relative 0 %   Basophils Absolute 0.0 0.0 - 0.1 K/uL   Immature Granulocytes 0 %   Abs Immature Granulocytes 0.09 (H) 0.00 - 0.07 K/uL  ABO/Rh     Status: None   Collection Time: 10/16/20  4:05 PM  Result Value Ref Range   ABO/RH(D)      AB NEG Performed at [facility], [address], [place], [place] [address]   Lipase, blood     Status: None   Collection Time: 10/16/20  7:28 PM  Result Value Ref Range   Lipase 23 11 - 51 U/L  hCG, quantitative, pregnancy     Status: Abnormal   Collection Time:  10/16/20  8:06 PM  Result Value Ref Range   hCG, Beta Chain, Quant, S 36,057 (H) <5 mIU/mL  Resp Panel by RT-PCR (Flu A&B, Covid) Nasopharyngeal Swab     Status: None   Collection Time: 10/16/20 11:41 PM   Specimen: Nasopharyngeal Swab; Nasopharyngeal(NP) swabs in vial transport medium  Result Value Ref Range   SARS Coronavirus 2 by RT PCR NEGATIVE NEGATIVE   Influenza A by PCR NEGATIVE NEGATIVE   Influenza B by PCR NEGATIVE NEGATIVE  Type and screen [place]     Status: None (Preliminary result)   Collection Time: 10/17/20 12:05 AM  Result Value Ref Range   ABO/RH(D) AB NEG    Antibody Screen NEG    Sample Expiration 10/20/2020,2359    Unit Number [id number]    Blood Component Type RED CELLS,LR    Unit division 00    Status of Unit ALLOCATED    Transfusion Status OK TO TRANSFUSE    Crossmatch Result Compatible    Unit Number [id number]    Blood Component Type RED CELLS,LR    Unit division 00    Status of Unit ALLOCATED    Transfusion Status OK TO TRANSFUSE    Crossmatch Result Compatible    Unit Number [id number]    Blood Component Type RED CELLS,LR    Unit division 00    Status of Unit ALLOCATED    Transfusion Status OK TO TRANSFUSE    Crossmatch Result Compatible    Unit  Number [id number]    Blood Component Type RED CELLS,LR    Unit division 00    Status of Unit ALLOCATED    Transfusion Status OK TO TRANSFUSE    Crossmatch Result      Compatible Performed at [facility], [place] [address], [place], [place] [address]   Prepare RBC (crossmatch)     Status: None   Collection Time: 10/17/20 12:05 AM  Result Value Ref Range   Order Confirmation      ORDER PROCESSED BY BLOOD BANK Performed at [facility], [place] [address], [place], [place] [address]   Prepare RBC (crossmatch)     Status: None   Collection Time: 10/17/20 12:22 AM  Result Value Ref Range   Order Confirmation      ORDER PROCESSED BY BLOOD BANK Performed at [facility], [place] [address], [place], [place] [address]   POCT I-Stat EG7     Status: Abnormal   Collection Time: 10/17/20  1:09 AM  Result Value Ref Range   pH, Ven 7.317 7.250 - 7.430   pCO2, Ven 39.5 (L) 44.0 - 60.0 mmHg   pO2, Ven 76.0 (H) 32.0 - 45.0 mmHg   Bicarbonate 20.2 20.0 - 28.0 mmol/L   TCO2 21 (L) 22 - 32 mmol/L   O2 Saturation 94.0 %   Acid-base deficit 5.0 (H) 0.0 - 2.0 mmol/L   Sodium 137 135 - 145 mmol/L   Potassium 4.4 3.5 - 5.1 mmol/L   Calcium, Ion 1.18 1.15 - 1.40 mmol/L   HCT 27.0 (L) 36.0 - 46.0 %   Hemoglobin 9.2 (L) 12.0 - 15.0 g/dL   Sample type VENOUS   POCT I-Stat EG7     Status: Abnormal   Collection Time: 10/17/20  2:50 AM  Result Value Ref Range   pH, Ven 7.320 7.250 - 7.430   pCO2, Ven 42.1 (L) 44.0 - 60.0 mmHg   pO2, Ven 70.0 (H) 32.0 - 45.0 mmHg  Bicarbonate 21.7 20.0 - 28.0 mmol/L   TCO2 23 22 - 32 mmol/L   O2 Saturation 92.0 %   Acid-base deficit 4.0 (H) 0.0 - 2.0 mmol/L   Sodium 136 135 - 145 mmol/L   Potassium 4.3 3.5 - 5.1 mmol/L   Calcium, Ion 1.14 (L) 1.15 - 1.40 mmol/L   HCT 23.0 (L) 36.0 - 46.0 %   Hemoglobin 7.8 (L) 12.0 - 15.0 g/dL   Sample type VENOUS   CBC     Status: Abnormal   Collection Time: 10/17/20  6:40 AM  Result Value Ref Range   WBC 17.0  (H) 4.0 - 10.5 K/uL   RBC 2.70 (L) 3.87 - 5.11 MIL/uL   Hemoglobin 8.4 (L) 12.0 - 15.0 g/dL   HCT 24.5 (L) 36.0 - 46.0 %   MCV 90.7 80.0 - 100.0 fL   MCH 31.1 26.0 - 34.0 pg   MCHC 34.3 30.0 - 36.0 g/dL   RDW 13.7 11.5 - 15.5 %   Platelets 371 150 - 400 K/uL   nRBC 0.0 0.0 - 0.2 %  Rh IG workup (includes ABO/Rh)     Status: None (Preliminary result)   Collection Time: 10/17/20  8:04 AM  Result Value Ref Range   Gestational Age(Wks) 3.1    Unit Number [id number]    Blood Component Type RHIG    Unit division 00    Status of Unit ISSUED    Transfusion Status      OK TO TRANSFUSE Performed at [facility], [place] [address], [place], [place] [address]     EXAM General: alert, cooperative, and appears stated age Resp: clear to auscultation bilaterally Cardio: regular rate and rhythm GI: incision: clean, dry, and intact Soft, BS present, normal tenderness postop. Extremities: no edema, redness or tenderness in the calves or thighs Vaginal Bleeding: minimal  Assessment: s/p Procedure(s): DIAGNOSTIC LAPAROSCOPY WITH REMOVAL OF ECTOPIC PREGNANCY LAPAROSCOPIC RIGHT SALPINGECTOMY, EVACUATION OF HEMOPERITONEUM: stable, progressing well, tolerating diet, and anemia with stable Hb at 8.4.  Plan: Advance diet Encourage ambulation Discontinue IV fluids Discharge home Rhophylac before d/c.   Percocet sent to pharmacy.  LOS: 1 day    Princess Bruins, MD 10/17/2020 8:41 AM

## 2020-10-17 NOTE — Op Note (Addendum)
Operative Note  10/17/2020  2:53 AM  PATIENT:  Leslie Guerrero  30 y.o. female  PRE-OPERATIVE DIAGNOSIS:  Ruptured Ectopic Pregnancy  POST-OPERATIVE DIAGNOSIS:  RUPTURED RIGHT TUBAL ECTOPIC PREGNANCY  PROCEDURE:  Procedure(s): LAPAROSCOPY WITH EVACUATION OF HEMOPERITONEUM AND  ECTOPIC PREGNANCY LAPAROSCOPIC RIGHT SALPINGECTOMY  SURGEON:  Surgeon(s): Princess Bruins, MD  ANESTHESIA:   general  FINDINGS: Large hemoperitoneum with 500 cc of blood evacuated.  Right tubal ampulla distended by ectopic pregnancy with distal tubal partial evacuation and bleeding.  Left tube with normal fimbria.  Bilateral ovaries normal.  Normal uterus.  DESCRIPTION OF OPERATION: Under general anesthesia with endotracheal intubation, the patient is in lithotomy position.  She is prepped with Betadine on the abdomen, suprapubic, vulvar and vaginal areas.  The Foley is put in place in the bladder.  She is draped as usual.  The speculum is inserted in the vagina and the anterior lip of the cervix is grasped with a tenaculum.  A 1 twos uterine cannula is put in place and the tenaculum is removed.  The speculum is removed.  Timeout is done.  Ancef 2 g IV was given.  We infiltrated the infraumbilical area with Marcaine one quarter plain.  A 1.5 cm incision is made at that level with the scalpel.  The aponeurosis is grasped with cokers and opened with Mayo scissors under direct vision.  The parietal peritoneum is opened bluntly with the finger.  A pursestring stitch of Vicryl 0 is done on the aponeurosis.  The Sheryle Hail is inserted at that level under direct vision and up pneumoperitoneum is created with CO2.  Inspection of the abdominopelvic cavities reveal a large hemoperitoneum.  We will infiltrate Marcaine one quarter plain on the left lower abdomen we make an incision with the scalpel over 1 cm and inserted a 10 mm port under direct vision.  We infiltrate the skin with Marcaine one quarter plain on the right lower abdomen  we make a 5 mm incision with a scalpel and insert a 5 mm port under direct vision.  The patient is placed in deep Trendelenburg.  We used the Nezhat to suction about 500 cc of blood which corresponded to the hemoperitoneum already present at the beginning of the surgery.  The right tube is severely distended at the ampulla and mild bleeding is present at the distal end of the tube suggesting a partial distal evacuation of an ectopic pregnancy.  The left tube is normal with good fimbriae visualized.  Both ovaries are normal in size and appearance.  The uterus is normal in size and appearance.  The harmonic scalpel is used to proceed with the right salpingectomy.  We cauterized and section with the harmonic scalpel at all from the distal aspect of the tube at the mesosalpinx all the way to the proximal aspect of the tube.  We then cauterize and section at the right cornua.  The right tube is completely removed and put in the posterior cul-de-sac.  Hemostasis at the mesosalpinx is completed with LigaSure.  Excellent hemostasis at that level.  We then used an Endobag at the left 10 mm port.  The right tube was easily put in the bag and removed from the abdominopelvic cavity.  The right tube was sent to pathology.  We then went back to complete irrigation and suction of the abdominopelvic cavity.  Patient was removed from Trendelenburg to assure that any abdominal blood would be suctioned as well.  Hemostasis was confirmed at all levels once more.  Pictures were taken at the beginning of surgery and then after completion of the right salpingectomy with removal of the ectopic.  All laparoscopic instruments were removed.  The ports were removed under direct vision.  The CO2 was evacuated.  The pursestring stitch was attached at the aponeurosis of the infraumbilical incision.  A separate stitch was done at the adipose tissue with a Monocryl 4-0.  The 3 incisions were closed with a subcuticular suture of Monocryl 4-0.   Dermabond was added on the 3 incisions.  The uterine cannula was removed from the vagina.  The Foley was removed in the operating room.  The patient was brought to PACU in good and stable conditions.  Her immediate postop hemoglobin was at 7.8.  The decision was made not to transfuse blood at this time.  ESTIMATED BLOOD LOSS: 500 mL of Hemoperitoneum present at the start of the surgery suctioned. 10 cc of Blood loss during surgery.   Intake/Output Summary (Last 24 hours) at 10/17/2020 0253 Last data filed at 10/17/2020 0244 Gross per 24 hour  Intake 1000 ml  Output 1000 ml  Net 0 ml     BLOOD ADMINISTERED:none   LOCAL MEDICATIONS USED:  MARCAINE     SPECIMEN:  Source of Specimen:  Right tube, ectopic pregnancy   DISPOSITION OF SPECIMEN:  PATHOLOGY  COUNTS:  YES  PLAN OF CARE: Transfer to PACU  Marie-Lyne LavoieMD2:53 AM

## 2020-10-17 NOTE — Anesthesia Preprocedure Evaluation (Addendum)
Anesthesia Evaluation  Patient identified by MRN, date of birth, ID band Patient awake    Reviewed: Allergy & Precautions, NPO status , Patient's Chart, lab work & pertinent test results  History of Anesthesia Complications Negative for: history of anesthetic complications  Airway Mallampati: III  TM Distance: >3 FB Neck ROM: Full    Dental  (+) Dental Advisory Given   Pulmonary neg pulmonary ROS,    breath sounds clear to auscultation       Cardiovascular  Rhythm:Regular Rate:Normal  H/o POTS '19 ECHO normal   Neuro/Psych  Headaches,    GI/Hepatic negative GI ROS, Neg liver ROS,   Endo/Other  Morbid obesity  Renal/GU negative Renal ROS     Musculoskeletal   Abdominal (+) + obese,   Peds  Hematology   Anesthesia Other Findings   Reproductive/Obstetrics (+) Pregnancy (ruptured ectopic)                            Anesthesia Physical Anesthesia Plan  ASA: 2 and emergent  Anesthesia Plan: General   Post-op Pain Management:    Induction: Intravenous and Rapid sequence  PONV Risk Score and Plan: 3 and Ondansetron, Dexamethasone and Scopolamine patch - Pre-op  Airway Management Planned: Oral ETT and Video Laryngoscope Planned  Additional Equipment: None  Intra-op Plan:   Post-operative Plan: Extubation in OR  Informed Consent: I have reviewed the patients History and Physical, chart, labs and discussed the procedure including the risks, benefits and alternatives for the proposed anesthesia with the patient or authorized representative who has indicated his/her understanding and acceptance.     Dental advisory given  Plan Discussed with: CRNA and Surgeon  Anesthesia Plan Comments:       Anesthesia Quick Evaluation

## 2020-10-17 NOTE — H&P (Signed)
Leslie Guerrero is an 30 y.o. female.  G1P0  LMP 09/25/2020 per patient  RP: Severe Rt abdominal pain x 11:30 am 10/16/20  HPI: Was attempting conception.  Had a menstrual like period 09/25/2020.  No HPT done.  Severe Rt abdominal pain with syncope starting at 11:30 am 10/16/2020.    BHCG at 36057 Hb from 13.1 at presentation to 9.7 now AB Negative Pelvic US:  Empty uterine cavity.  Large amounts of fluid in abdomen and pelvis.  Pertinent Gynecological History: Menses: LMP 09/25/2020 per patient Contraception: none Blood transfusions: none Previous GYN Procedures:  None   Last pap: normal  OB History: G1, P0  Menstrual History:  Patient's last menstrual period was 09/25/2020 (exact date).    Past Medical History:  Diagnosis Date   Migraines    POTS (postural orthostatic tachycardia syndrome)    Thrombocytosis 05/2011   platelet count 531,000    Past Surgical History:  Procedure Laterality Date   Fatty mass removed     Nexplanon insertion  04/25/2016    Family History  Problem Relation Age of Onset   Hypertension Mother    Diabetes Mother    Fibromyalgia Mother    Migraines Mother    Hypertension Father    Heart attack Father    Diabetes Paternal Uncle    Breast cancer Paternal Grandmother    Diabetes Paternal Grandfather    Stroke Paternal Grandfather     Social History:  reports that she has never smoked. She has never used smokeless tobacco. She reports current alcohol use of about 1.0 standard drink per week. She reports that she does not use drugs.  Allergies:  Allergies  Allergen Reactions   Fluoxetine Other (See Comments)    "Gave me a headache and I felt badly"   Lavender Oil Hives   Shellfish-Derived Products Hives, Itching and Other (See Comments)    Sweating, also (no breathing issues, however)   Nickel Rash    Medications Prior to Admission  Medication Sig Dispense Refill Last Dose   cyclobenzaprine (FLEXERIL) 10 MG tablet Take 10 mg by mouth 3  (three) times daily as needed for muscle spasms.   10/15/2020   EXCEDRIN MIGRAINE 250-250-65 MG tablet Take 1-2 tablets by mouth every 6 (six) hours as needed for headache or migraine.   10/15/2020   acetaminophen (TYLENOL) 500 MG tablet Take 2 tablets (1,000 mg total) by mouth every 6 (six) hours as needed for mild pain, moderate pain or headache. (Patient not taking: Reported on 10/16/2020) 30 tablet 0 Not Taking    REVIEW OF SYSTEMS: A ROS was performed and pertinent positives and negatives are included in the history.  GENERAL: No fevers or chills. HEENT: No change in vision, no earache, sore throat or sinus congestion. NECK: No pain or stiffness. CARDIOVASCULAR: No chest pain or pressure. No palpitations. PULMONARY: No shortness of breath, cough or wheeze. GASTROINTESTINAL: No abdominal pain, nausea, vomiting or diarrhea, melena or bright red blood per rectum. GENITOURINARY: No urinary frequency, urgency, hesitancy or dysuria. MUSCULOSKELETAL: No joint or muscle pain, no back pain, no recent trauma. DERMATOLOGIC: No rash, no itching, no lesions. ENDOCRINE: No polyuria, polydipsia, no heat or cold intolerance. No recent change in weight. HEMATOLOGICAL: No anemia or easy bruising or bleeding. NEUROLOGIC: No headache, seizures, numbness, tingling or weakness. PSYCHIATRIC: No depression, no loss of interest in normal activity or change in sleep pattern.     Blood pressure 135/68, pulse (!) 117, temperature 98.4 F (36.9 C), temperature source  Oral, resp. rate (!) 21, height 5\' 2"  (1.575 m), weight 96.6 kg, last menstrual period 09/25/2020, SpO2 100 %.  Physical Exam:  See office notes   Results for orders placed or performed during the hospital encounter of 10/16/20 (from the past 24 hour(s))  CBC     Status: Abnormal   Collection Time: 10/16/20 12:50 AM  Result Value Ref Range   WBC 17.6 (H) 4.0 - 10.5 K/uL   RBC 3.19 (L) 3.87 - 5.11 MIL/uL   Hemoglobin 9.7 (L) 12.0 - 15.0 g/dL   HCT 30.1 (L)  36.0 - 46.0 %   MCV 94.4 80.0 - 100.0 fL   MCH 30.4 26.0 - 34.0 pg   MCHC 32.2 30.0 - 36.0 g/dL   RDW 13.6 11.5 - 15.5 %   Platelets 449 (H) 150 - 400 K/uL   nRBC 0.0 0.0 - 0.2 %  Comprehensive metabolic panel     Status: Abnormal   Collection Time: 10/16/20  1:50 PM  Result Value Ref Range   Sodium 137 135 - 145 mmol/L   Potassium 3.2 (L) 3.5 - 5.1 mmol/L   Chloride 108 98 - 111 mmol/L   CO2 19 (L) 22 - 32 mmol/L   Glucose, Bld 96 70 - 99 mg/dL   BUN 11 6 - 20 mg/dL   Creatinine, Ser 0.72 0.44 - 1.00 mg/dL   Calcium 8.5 (L) 8.9 - 10.3 mg/dL   Total Protein 6.2 (L) 6.5 - 8.1 g/dL   Albumin 3.1 (L) 3.5 - 5.0 g/dL   AST 25 15 - 41 U/L   ALT 28 0 - 44 U/L   Alkaline Phosphatase 60 38 - 126 U/L   Total Bilirubin 0.7 0.3 - 1.2 mg/dL   GFR, Estimated >60 >60 mL/min   Anion gap 10 5 - 15  I-Stat beta hCG blood, ED     Status: Abnormal   Collection Time: 10/16/20  3:56 PM  Result Value Ref Range   I-stat hCG, quantitative >2,000.0 (H) <5 mIU/mL   Comment 3          CBC with Differential/Platelet     Status: Abnormal   Collection Time: 10/16/20  4:05 PM  Result Value Ref Range   WBC 20.1 (H) 4.0 - 10.5 K/uL   RBC 4.23 3.87 - 5.11 MIL/uL   Hemoglobin 13.1 12.0 - 15.0 g/dL   HCT 38.4 36.0 - 46.0 %   MCV 90.8 80.0 - 100.0 fL   MCH 31.0 26.0 - 34.0 pg   MCHC 34.1 30.0 - 36.0 g/dL   RDW 13.5 11.5 - 15.5 %   Platelets 544 (H) 150 - 400 K/uL   nRBC 0.0 0.0 - 0.2 %   Neutrophils Relative % 91 %   Neutro Abs 18.2 (H) 1.7 - 7.7 K/uL   Lymphocytes Relative 6 %   Lymphs Abs 1.1 0.7 - 4.0 K/uL   Monocytes Relative 3 %   Monocytes Absolute 0.6 0.1 - 1.0 K/uL   Eosinophils Relative 0 %   Eosinophils Absolute 0.0 0.0 - 0.5 K/uL   Basophils Relative 0 %   Basophils Absolute 0.0 0.0 - 0.1 K/uL   Immature Granulocytes 0 %   Abs Immature Granulocytes 0.09 (H) 0.00 - 0.07 K/uL  ABO/Rh     Status: None   Collection Time: 10/16/20  4:05 PM  Result Value Ref Range   ABO/RH(D)      AB  NEG Performed at [facility], [address],  [place] [address]   Lipase, blood     Status: None   Collection Time: 10/16/20  7:28 PM  Result Value Ref Range   Lipase 23 11 - 51 U/L  hCG, quantitative, pregnancy     Status: Abnormal   Collection Time: 10/16/20  8:06 PM  Result Value Ref Range   hCG, Beta Chain, Quant, S 36,057 (H) <5 mIU/mL  Prepare RBC (crossmatch)     Status: None   Collection Time: 10/17/20 12:22 AM  Result Value Ref Range   Order Confirmation      ORDER PROCESSED BY BLOOD BANK Performed at [facility], [place] [address] [place], [place] [address]     US Abdomen Complete  Result Date: 10/16/2020 CLINICAL DATA:  Upper abdominal pain EXAM: ABDOMEN ULTRASOUND COMPLETE COMPARISON:  CT 09/16/2017 FINDINGS: Gallbladder: No gallstones or wall thickening visualized. No sonographic Murphy sign noted by sonographer. Common bile duct: Diameter: 2.1 mm Liver: No focal lesion identified. Within normal limits in parenchymal echogenicity. Portal vein is patent on color Doppler imaging with normal direction of blood flow towards the liver. IVC: No abnormality visualized. Pancreas: Not visualized due to bowel gas Spleen: Size and appearance within normal limits. Right Kidney: Length: 11.2 cm. Echogenicity within normal limits. No mass or hydronephrosis visualized. Left Kidney: Length: 10.6 cm. Echogenicity within normal limits. No mass or hydronephrosis visualized. Abdominal aorta: Largely obscured by bowel gas. Other findings: Small moderate ascites within the right upper quadrant. Small amount of ascites within the left lower quadrant. IMPRESSION: 1. Negative for gallstones. 2. Pancreas and aorta largely obscured by bowel gas 3. Small moderate ascites in the right upper quadrant of unclear source Electronically Signed   By: Donavan Foil M.D.   On: 10/16/2020 20:55     Assessment/Plan: Probable ectopic pregnancy with large hemoperitoneum.  Tachycardic with Hb drop  from 13.1 to 9.7.   AB Neg  Decision to proceed as rapidly as possible with a Laparoscopy treatment of probable ruptured ectopic pregnancy, probable Salpingectomy, possible laparotomy.  Surgery and risks reviewed with patient.  Crossed match for possible blood transfusion.  Informed consent signed.  Transferred to [place].   Marie-Lyne Colbert Cu[place] 10/17/2020, 12:45 AM

## 2020-10-18 ENCOUNTER — Telehealth: Payer: Self-pay | Admitting: *Deleted

## 2020-10-18 ENCOUNTER — Encounter (HOSPITAL_COMMUNITY): Payer: Self-pay | Admitting: Obstetrics & Gynecology

## 2020-10-18 LAB — TYPE AND SCREEN
ABO/RH(D): AB NEG
Antibody Screen: NEGATIVE
Unit division: 0
Unit division: 0
Unit division: 0
Unit division: 0

## 2020-10-18 LAB — BPAM RBC
Blood Product Expiration Date: 202210172359
Blood Product Expiration Date: 202210222359
Blood Product Expiration Date: 202210232359
Blood Product Expiration Date: 202210232359
ISSUE DATE / TIME: 202209250100
ISSUE DATE / TIME: 202209250100
ISSUE DATE / TIME: 202209250100
ISSUE DATE / TIME: 202209250100
Unit Type and Rh: 600
Unit Type and Rh: 600
Unit Type and Rh: 600
Unit Type and Rh: 600

## 2020-10-18 LAB — RH IG WORKUP (INCLUDES ABO/RH)
Gestational Age(Wks): 3.1
Unit division: 0

## 2020-10-18 NOTE — Telephone Encounter (Signed)
Patient called with a question. When should she go back to work after surgery that was done 10/17/2020. I will write a letter up for her employer once I verify with Provider.

## 2020-10-19 LAB — SURGICAL PATHOLOGY

## 2020-10-20 NOTE — Progress Notes (Signed)
Medications wasted after patient was discharged. 0.5 of Dilauded and 0.5 of Demerol wasted by L. Calianne Larue RN and Jasper Riling RN.

## 2020-10-21 NOTE — Progress Notes (Signed)
This is an error that occurred with trying to create a letter.

## 2020-10-21 NOTE — Telephone Encounter (Signed)
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 She said she thinks 3 weeks is fine. Patient agrees that she needs this time for recovery.  Letter for her to present to employer was provided through My Chart. She will present that and find out what/if any forms she will need to have completed and let us know.

## 2020-10-29 ENCOUNTER — Ambulatory Visit (INDEPENDENT_AMBULATORY_CARE_PROVIDER_SITE_OTHER): Payer: BC Managed Care – PPO | Admitting: Obstetrics & Gynecology

## 2020-10-29 ENCOUNTER — Encounter: Payer: Self-pay | Admitting: Obstetrics & Gynecology

## 2020-10-29 ENCOUNTER — Other Ambulatory Visit: Payer: Self-pay

## 2020-10-29 VITALS — BP 108/68

## 2020-10-29 DIAGNOSIS — R300 Dysuria: Secondary | ICD-10-CM | POA: Diagnosis not present

## 2020-10-29 DIAGNOSIS — Z8759 Personal history of other complications of pregnancy, childbirth and the puerperium: Secondary | ICD-10-CM

## 2020-10-29 DIAGNOSIS — Z09 Encounter for follow-up examination after completed treatment for conditions other than malignant neoplasm: Secondary | ICD-10-CM | POA: Diagnosis not present

## 2020-10-29 MED ORDER — NORETHINDRONE 0.35 MG PO TABS: 1.0000 | ORAL_TABLET | Freq: Every day | ORAL | 1 refills | Status: DC

## 2020-10-29 MED ORDER — SULFAMETHOXAZOLE-TRIMETHOPRIM 800-160 MG PO TABS: 1.0000 | ORAL_TABLET | Freq: Two times a day (BID) | ORAL | 0 refills | Status: AC

## 2020-10-29 NOTE — Progress Notes (Signed)
    Sandra Brents Mar 01, 1990 [id number]        30 y.o.  G1P0010   RP: Post op LPS Rt Salpingectomy for ruptured ectopic 2 weeks ago  HPI: Good postop evolution. No vaginal bleeding or d/c.  Mild lower abdominal discomfort.  C/O burning with urination.  No fever.   OB History  Gravida Para Term Preterm AB Living  1       1    SAB IAB Ectopic Multiple Live Births      1        # Outcome Date GA Lbr Len/2nd Weight Sex Delivery Anes PTL Lv  1 Ectopic             Past medical history,surgical history, problem list, medications, allergies, family history and social history were all reviewed and documented in the EPIC chart.   Directed ROS with pertinent positives and negatives documented in the history of present illness/assessment and plan.  Exam:  Vitals:   10/29/20 1546  BP: 108/68   General appearance:  Normal  Abdomen: Soft, NT.  Incisions intact, closed with no erythema.  Gynecologic exam: Vulva normal.  Bimanual exam:  Uterus NT, normal volume, mobile.  No adnexal mass/NT.  U/A: Yellow cloudy, nitrites negative, white blood cells 6-10, red blood cells negative, moderate bacteria.  Urine culture pending.   Assessment/Plan:  30 y.o. G1P0010   1. Status post gynecological surgery, follow-up exam Good healing post op.  No complication.  Pathology showed ectopic pregnancy in the right tube.  Patient would like to attempt conception in about 3 months.  We will start on the progestin only pill now and continue for 3 months.  In about 6 weeks from now we will proceed with hysterosalpingography under Doxy to assess the patency of the left tube.  2. Hx of ectopic pregnancy  Hysterosalpingogram under Doxy to be scheduled. - DG Hysterogram (HSG); Future  3. Dysuria Urine analysis abnormal.  Decision to treat with Bactrim DS.  Usage reviewed and prescription sent to pharmacy.  Pending urine culture. - Urinalysis,Complete w/RFL Culture   Other orders -  sulfamethoxazole-trimethoprim (BACTRIM DS) 800-160 MG tablet; Take 1 tablet by mouth 2 (two) times daily for 3 days. - norethindrone (MICRONOR) 0.35 MG tablet; Take 1 tablet (0.35 mg total) by mouth daily.   Princess Bruins MD, 4:09 PM 10/29/2020

## 2020-10-31 LAB — URINALYSIS, COMPLETE W/RFL CULTURE
Bilirubin Urine: NEGATIVE
Glucose, UA: NEGATIVE
Hgb urine dipstick: NEGATIVE
Hyaline Cast: NONE SEEN /LPF
Ketones, ur: NEGATIVE
Nitrites, Initial: NEGATIVE
Protein, ur: NEGATIVE
RBC / HPF: NONE SEEN /HPF (ref 0–2)
Specific Gravity, Urine: 1.025 (ref 1.001–1.035)
pH: 6 (ref 5.0–8.0)

## 2020-10-31 LAB — URINE CULTURE
MICRO NUMBER:: 12475551
SPECIMEN QUALITY:: ADEQUATE

## 2020-10-31 LAB — CULTURE INDICATED

## 2020-11-01 ENCOUNTER — Telehealth: Payer: Self-pay

## 2020-11-01 NOTE — Telephone Encounter (Signed)
Left message to call.

## 2020-11-01 NOTE — Telephone Encounter (Signed)
Leslie Bruins, MD  [facility] Triage  Rt ectopic s/p Rt Salpingectomy.  HSG under Doxy in 6 weeks to evaluate the Left tube before attempting conception again.

## 2020-12-02 NOTE — Telephone Encounter (Signed)
I called patient again and per DPR access note on file I left detailed message that if she would like to have HSG needs to call us on Day One of her cycle so that we can schedule for her. I did explain it is scheduled around her menses so important to call as soon as she can after it starts.

## 2021-07-12 ENCOUNTER — Other Ambulatory Visit: Payer: Self-pay | Admitting: Obstetrics & Gynecology

## 2021-07-12 NOTE — Telephone Encounter (Signed)
Last annual exam in 8/22 No annual scheduled yet.

## 2021-08-23 ENCOUNTER — Encounter: Payer: Self-pay | Admitting: Nurse Practitioner

## 2021-08-24 ENCOUNTER — Ambulatory Visit: Payer: BC Managed Care – PPO | Admitting: Nurse Practitioner

## 2021-08-24 ENCOUNTER — Encounter: Payer: Self-pay | Admitting: Nurse Practitioner

## 2021-08-24 VITALS — BP 128/64 | HR 72 | Ht 62.0 in | Wt 218.0 lb

## 2021-08-24 DIAGNOSIS — N939 Abnormal uterine and vaginal bleeding, unspecified: Secondary | ICD-10-CM | POA: Diagnosis not present

## 2021-08-24 DIAGNOSIS — N920 Excessive and frequent menstruation with regular cycle: Secondary | ICD-10-CM

## 2021-08-24 LAB — PREGNANCY, URINE: Preg Test, Ur: NEGATIVE

## 2021-08-24 MED ORDER — SLYND 4 MG PO TABS: 1.0000 | ORAL_TABLET | Freq: Every day | ORAL | 1 refills | Status: DC

## 2021-08-24 NOTE — Progress Notes (Signed)
   Acute Office Visit  Subjective:    Patient ID: Leslie Guerrero, female    DOB: 01/28/1990, 31 y.o.   MRN: [id number]   HPI 31 y.o. presents today for frequent menses. Menses have been occurring every 2 weeks x 2 months. First 2 days are heavy then tapers off. Taking Norethindrone, restarted after ectopic in September. Has been more tired and with lower libido since menstrual changes. H/O migraine with aura. Plans to try to conceive in the fall.    Review of Systems  Constitutional: Negative.   Genitourinary:  Positive for menstrual problem.       Objective:    Physical Exam Constitutional:      Appearance: Normal appearance.  Genitourinary:    General: Normal vulva.     Vagina: Normal.     Cervix: Normal.     Uterus: Normal.      BP 128/64   Pulse 72   Ht '5\' 2"'$  (1.575 m)   Wt 218 lb (98.9 kg)   LMP 08/08/2021 (Exact Date)   SpO2 100%   Breastfeeding No   BMI 39.87 kg/m  Wt Readings from Last 3 Encounters:  08/24/21 218 lb (98.9 kg)  10/16/20 213 lb (96.6 kg)  09/22/20 213 lb (96.6 kg)        Patient informed chaperone available to be present for breast and/or pelvic exam. Patient has requested no chaperone to be present. Patient has been advised what will be completed during breast and pelvic exam.   UPT negative  Assessment & Plan:   Problem List Items Addressed This Visit   None Visit Diagnoses     Abnormal uterine bleeding    -  Primary   Relevant Medications   Drospirenone (SLYND) 4 MG TABS   Other Relevant Orders   Pregnancy, urine (Completed)   CBC with Differential/Platelet   TSH   Unusually frequent menses       Relevant Medications   Drospirenone (SLYND) 4 MG TABS   Other Relevant Orders   TSH      Plan: TSH, CBC today. Neg UPT. Discussed switching to different POP versus ultrasound. She would like to try POP switch first. If abnormal bleeding continues we will proceed with ultrasound. She is agreeable to plan.      [place], 3:21 PM 08/24/2021

## 2021-08-25 LAB — CBC WITH DIFFERENTIAL/PLATELET
Absolute Monocytes: 837 cells/uL (ref 200–950)
Basophils Absolute: 54 cells/uL (ref 0–200)
Basophils Relative: 0.4 %
Eosinophils Absolute: 149 cells/uL (ref 15–500)
Eosinophils Relative: 1.1 %
HCT: 44.3 % (ref 35.0–45.0)
Hemoglobin: 14.7 g/dL (ref 11.7–15.5)
Lymphs Abs: 2444 cells/uL (ref 850–3900)
MCH: 29.6 pg (ref 27.0–33.0)
MCHC: 33.2 g/dL (ref 32.0–36.0)
MCV: 89.1 fL (ref 80.0–100.0)
MPV: 9 fL (ref 7.5–12.5)
Monocytes Relative: 6.2 %
Neutro Abs: 10017 cells/uL — ABNORMAL HIGH (ref 1500–7800)
Neutrophils Relative %: 74.2 %
Platelets: 514 10*3/uL — ABNORMAL HIGH (ref 140–400)
RBC: 4.97 10*6/uL (ref 3.80–5.10)
RDW: 13 % (ref 11.0–15.0)
Total Lymphocyte: 18.1 %
WBC: 13.5 10*3/uL — ABNORMAL HIGH (ref 3.8–10.8)

## 2021-08-25 LAB — TSH: TSH: 1.9 mIU/L

## 2021-09-27 ENCOUNTER — Encounter: Payer: Self-pay | Admitting: Nurse Practitioner

## 2021-09-27 ENCOUNTER — Ambulatory Visit (INDEPENDENT_AMBULATORY_CARE_PROVIDER_SITE_OTHER): Payer: BC Managed Care – PPO | Admitting: Nurse Practitioner

## 2021-09-27 VITALS — BP 110/80 | Ht 62.0 in | Wt 218.0 lb

## 2021-09-27 DIAGNOSIS — N921 Excessive and frequent menstruation with irregular cycle: Secondary | ICD-10-CM

## 2021-09-27 DIAGNOSIS — Z01419 Encounter for gynecological examination (general) (routine) without abnormal findings: Secondary | ICD-10-CM

## 2021-09-27 NOTE — Progress Notes (Signed)
   Daryan Cagley [birth year] [id number]   History:  31 y.o. G1P0010 presents for annual exam. Seen 08/24/2021 for frequent menses while taking Norethindrone. Switched to [place], no bleeding since starting. Normal pap history. Migraines with aura managed by neuro.   Gynecologic History Patient's last menstrual period was 09/14/2021. Period Pattern: (!) Irregular Menstrual Flow: Moderate Menstrual Control: Maxi pad, Tampon Dysmenorrhea: (!) Mild Dysmenorrhea Symptoms: Cramping Contraception: oral progesterone-only contraceptive Sexually active: Yes  Health Maintenance Last Pap: 05/22/2018. Results were: Normal, 5-year repeat Last mammogram: 2018. Results were: Normal Last colonoscopy: Not indicated Last Dexa: Not indicated  Past medical history, past surgical history, family history and social history were all reviewed and documented in the EPIC chart. Married. Engineer, manufacturing systems. Husband is Engineer, technical sales for  [employer].   ROS:  A ROS was performed and pertinent positives and negatives are included.  Exam:  Vitals:   09/27/21 1559  BP: 110/80  Weight: 218 lb (98.9 kg)  Height: '5\' 2"'$  (1.575 m)     Body mass index is 39.87 kg/m.  General appearance:  Normal Thyroid:  Symmetrical, normal in size, without palpable masses or nodularity. Respiratory  Auscultation:  Clear without wheezing or rhonchi Cardiovascular  Auscultation:  Regular rate, without rubs, murmurs or gallops  Edema/varicosities:  Not grossly evident Abdominal  Soft,nontender, without masses, guarding or rebound.  Liver/spleen:  No organomegaly noted  Hernia:  None appreciated  Skin  Inspection:  Grossly normal   Breasts: Examined lying and sitting.   Right: Without masses, retractions, discharge or axillary adenopathy.   Left: Fatty mass in left axillary. Without retractions, discharge or axillary adenopathy. Genitourinary Not indicated  Patient informed chaperone available to be present for breast  and pelvic exam. Patient has requested no chaperone to be present. Patient has been advised what will be completed during breast and pelvic exam.    Assessment/Plan:  31 y.o. G1P0010 for annual exam.   Well female exam with routine gynecological exam -  Education provided on SBEs, importance of preventative screenings, current guidelines, high calcium diet, regular exercise, and multivitamin daily. Labs with PCP.   Menorrhagia with irregular cycle - Seen 08/24/2021 for frequent menses while taking Norethindrone. Switched to [place], no bleeding since starting. Will continue to monitor bleeding.   Screening for cervical cancer - Normal Pap history.  Will repeat at 5-year interval per guidelines.  Follow up in 1 year for annual.      Tamela Gammon [facility], 4:08 PM 09/27/2021

## 2021-11-14 ENCOUNTER — Other Ambulatory Visit: Payer: Self-pay | Admitting: Nurse Practitioner

## 2021-11-14 DIAGNOSIS — N939 Abnormal uterine and vaginal bleeding, unspecified: Secondary | ICD-10-CM

## 2021-11-14 DIAGNOSIS — N920 Excessive and frequent menstruation with regular cycle: Secondary | ICD-10-CM

## 2021-11-15 NOTE — Telephone Encounter (Signed)
Last annual exam was 09/2021

## 2022-01-03 ENCOUNTER — Ambulatory Visit (HOSPITAL_COMMUNITY)
Admission: RE | Admit: 2022-01-03 | Discharge: 2022-01-03 | Disposition: A | Discharge status: Home / Self Care | Payer: BC Managed Care – PPO | Source: Ambulatory Visit | Attending: Emergency Medicine | Admitting: Emergency Medicine

## 2022-01-03 ENCOUNTER — Encounter (HOSPITAL_COMMUNITY): Payer: Self-pay

## 2022-01-03 VITALS — BP 125/85 | HR 95 | Temp 98.2°F | Resp 18

## 2022-01-03 DIAGNOSIS — R051 Acute cough: Secondary | ICD-10-CM

## 2022-01-03 MED ORDER — PROMETHAZINE-DM 6.25-15 MG/5ML PO SYRP: 5.0000 mL | ORAL_SOLUTION | Freq: Every evening | ORAL | 0 refills | Status: DC

## 2022-01-03 MED ORDER — ALBUTEROL SULFATE HFA 108 (90 BASE) MCG/ACT IN AERS
INHALATION_SPRAY | RESPIRATORY_TRACT | Status: AC
  Filled 2022-01-03: qty 6.7

## 2022-01-03 MED ORDER — ALBUTEROL SULFATE HFA 108 (90 BASE) MCG/ACT IN AERS
2.0000 | INHALATION_SPRAY | Freq: Once | RESPIRATORY_TRACT | Status: AC
  Administered 2022-01-03: 2 via RESPIRATORY_TRACT

## 2022-01-03 MED ORDER — PREDNISONE 10 MG PO TABS: ORAL_TABLET | ORAL | 0 refills | Status: DC

## 2022-01-03 NOTE — Discharge Instructions (Addendum)
A prednisone taper has been prescribed , start taking this medication tomorrow, take 6 tabs by mouth on day 1, then 5 tabs on day 2,  then 4 tabs on day 3, then 3 tabs on day 4, 2 tabs on day 5, then 1 tab by mouth on day 6.   Promethazine DM has been sent to the pharmacy, you can use this medication before bedtime, please do not operate any heavy machinery or drive a car after taking this medication.   An albuterol inhaler was given to you while in office today, please use this albuterol inhaler every 6 hours for the next few days.

## 2022-01-03 NOTE — ED Triage Notes (Signed)
Pt states she has had a cough x 3 weeks taking OTC meds without relief. She complains worse at night, no other sx.

## 2022-01-03 NOTE — ED Provider Notes (Signed)
[facility]    CSN: [id number] Arrival date & time: 01/03/22  1748      History   Chief Complaint Chief Complaint  Patient presents with   Cough    I have had a cough for the last 2 to 3 weeks. Gets worse at night. Sometimes cough up mucus. Believe it may be bronchitis.? - Entered by patient    HPI Leslie Guerrero is a 31 y.o. female.  Patient presents due to productive cough that has been ongoing for the past 3 weeks. Patient reports an episode of "whistling" that was heard while she was breathing at night.  Patient reports coughing fits almost bring her to the point of vomiting at times. Patient denies any other cold symptoms that have occurred upon onset or during the duration of her symptoms.  Patient reports cough is worsened at night when laying down.  She reports that the cough is now interrupting her sleep.  Patient reports green to yellow sputum when coughing.  Patient denies any recent viral illness that she is aware of.  Denies any history of chronic respiratory illness.  Denies any history of seasonal allergies.  Denies any tobacco use history.  She has used over-the-counter medications such as dextromethorphan with no relief of symptoms.    Cough Associated symptoms: no chest pain, no chills, no diaphoresis, no fever, no rhinorrhea, no shortness of breath, no sore throat and no wheezing     Past Medical History:  Diagnosis Date   Migraines    POTS (postural orthostatic tachycardia syndrome)    Thrombocytosis 05/2011   platelet count 531,000    Patient Active Problem List   Diagnosis Date Noted   Postoperative state 10/17/2020   Migraines 05/21/2018   Inappropriate sinus tachycardia 09/16/2017   Laceration of right upper arm 09/16/2017    Past Surgical History:  Procedure Laterality Date   DIAGNOSTIC LAPAROSCOPY WITH REMOVAL OF ECTOPIC PREGNANCY N/A 10/17/2020   Procedure: DIAGNOSTIC LAPAROSCOPY WITH REMOVAL OF ECTOPIC PREGNANCY;  Surgeon:  Princess Bruins, MD;  Location: [facility];  Service: Gynecology;  Laterality: N/A;   Fatty mass removed     LAPAROSCOPIC UNILATERAL SALPINGECTOMY Right 10/17/2020   Procedure: LAPAROSCOPIC RIGHT SALPINGECTOMY, EVACUATION OF HEMOPERITONEUM;  Surgeon: Princess Bruins, MD;  Location: [facility];  Service: Gynecology;  Laterality: Right;   Nexplanon insertion  04/25/2016    OB History     Gravida  1   Para      Term      Preterm      AB  1   Living         SAB      IAB      Ectopic  1   Multiple      Live Births               Home Medications    Prior to Admission medications   Medication Sig Start Date End Date Taking? Authorizing Provider  predniSONE (DELTASONE) 10 MG tablet Take 6 tablets on the first day, 5 tablets on the second day, 4 tablets on the third day, 3 tablets on the fourth day, 2 tablets on the fifth day, and 1 tablet on the last day. 01/03/22  Yes Flossie Dibble, NP  promethazine-dextromethorphan (PROMETHAZINE-DM) 6.25-15 MG/5ML syrup Take 5 mLs by mouth at bedtime. 01/03/22  Yes Flossie Dibble, NP  cyclobenzaprine (FLEXERIL) 10 MG tablet Take 10 mg by mouth 3 (three) times daily as needed for muscle spasms.  [provider]  Drospirenone (SLYND) 4 MG TABS Take 1 tablet by mouth daily. 08/24/21   Tamela Gammon, NP  EXCEDRIN MIGRAINE [id number] MG tablet Take 1-2 tablets by mouth every 6 (six) hours as needed for headache or migraine.    [provider]    Family History Family History  Problem Relation Age of Onset   Hypertension Mother    Diabetes Mother    Fibromyalgia Mother    Migraines Mother    Hypertension Father    Heart attack Father    Diabetes Paternal Uncle    Breast cancer Paternal Grandmother    Diabetes Paternal Grandfather    Stroke Paternal Grandfather     Social History Social History   Tobacco Use   Smoking status: Never   Smokeless tobacco: Never  Vaping Use   Vaping Use: Never used   Substance Use Topics   Alcohol use: Yes    Alcohol/week: 1.0 standard drink of alcohol    Types: 1 Standard drinks or equivalent per week    Comment: socially   Drug use: No     Allergies   Fluoxetine, Lavender oil, Shellfish-derived products, and Nickel   Review of Systems Review of Systems  Constitutional:  Negative for activity change, appetite change, chills, diaphoresis, fatigue and fever.  HENT:  Negative for congestion, postnasal drip, rhinorrhea, sinus pressure, sinus pain, sneezing, sore throat, trouble swallowing and voice change.   Respiratory:  Positive for cough. Negative for apnea, choking, chest tightness, shortness of breath, wheezing and stridor.   Cardiovascular:  Negative for chest pain and palpitations.     Physical Exam Triage Vital Signs ED Triage Vitals  Enc Vitals Group     BP 01/03/22 1826 125/85     Pulse Rate 01/03/22 1826 95     Resp 01/03/22 1826 18     Temp 01/03/22 1826 98.2 F (36.8 C)     Temp Source 01/03/22 1826 Oral     SpO2 01/03/22 1826 98 %     Weight --      Height --      Head Circumference --      Peak Flow --      Pain Score 01/03/22 1824 0     Pain Loc --      Pain Edu? --      Excl. in [place]? --    No data found.  Updated Vital Signs BP 125/85 (BP Location: Left Arm)   Pulse 95   Temp 98.2 F (36.8 C) (Oral)   Resp 18   LMP 01/03/2022 (Exact Date)   SpO2 98%   Physical Exam Vitals and nursing note reviewed.  Constitutional:      Appearance: Normal appearance.  Cardiovascular:     Rate and Rhythm: Normal rate and regular rhythm.     Heart sounds: Normal heart sounds, S1 normal and S2 normal.  Pulmonary:     Effort: Pulmonary effort is normal. No tachypnea or bradypnea.     Breath sounds: Examination of the right-lower field reveals decreased breath sounds. Examination of the left-lower field reveals decreased breath sounds. Decreased breath sounds present. No wheezing, rhonchi or rales.     Comments: Wheezing  was noted bilaterally in all quadrants after Albuterol administration.  Neurological:     Mental Status: She is alert.      UC Treatments / Results  Labs (all labs ordered are listed, but only abnormal results are displayed) Labs Reviewed - No data to display  EKG   Radiology No results found.  Procedures Procedures (including critical care time)  Medications Ordered in UC Medications  albuterol (VENTOLIN HFA) 108 (90 Base) MCG/ACT inhaler 2 puff (2 puffs Inhalation Given 01/03/22 1856)    Initial Impression / Assessment and Plan / UC Course  I have reviewed the triage vital signs and the nursing notes.  Pertinent labs & imaging results that were available during my care of the patient were reviewed by me and considered in my medical decision making (see chart for details).     Acute cough. Possible Inflammatory process caused by an initial viral illness of unknown etiology, albuterol inhaler (prescribed in office) gave indication to this, patient began to have wheeze after albuterol administration which was not heard prior.  Low suspicion of pneumonia based on patient's presentation. Prednisone taper was sent to the pharmacy, patient was made aware of treatment regiment.  Patient was made aware to continue using the albuterol inhaler.  Promethazine DM prescription was sent for nighttime use, due to patient's complaint of difficulty sleeping at night due to cough.  Patient was made aware of safety precautions with Promethazine DM.  Patient became disgruntled during the visit.  She stated that she was upset about how long that she had been in office and she felt as though this writer did not listen to her concerns completely when initially assessing her.  This writer reassured patient that her care was my top priority and I was very empathetic to her complaint.  Uncertain as to whether patient verbalized understanding of my reassurance to her based off of her response.   Patient  was made aware of timeline for symptom resolution and when follow-up would be necessary.   Charting was provided using a a verbal dictation system, charting was proofread for errors, errors may occur which could change the meaning of the information charted.   Final Clinical Impressions(s) / UC Diagnoses   Final diagnoses:  Acute cough     Discharge Instructions      A prednisone taper has been prescribed , start taking this medication tomorrow, take 6 tabs by mouth on day 1, then 5 tabs on day 2,  then 4 tabs on day 3, then 3 tabs on day 4, 2 tabs on day 5, then 1 tab by mouth on day 6.   Promethazine DM has been sent to the pharmacy, you can use this medication before bedtime, please do not operate any heavy machinery or drive a car after taking this medication.   An albuterol inhaler was given to you while in office today, please use this albuterol inhaler every 6 hours for the next few days.        ED Prescriptions     Medication Sig Dispense Auth. Provider   predniSONE (DELTASONE) 10 MG tablet Take 6 tablets on the first day, 5 tablets on the second day, 4 tablets on the third day, 3 tablets on the fourth day, 2 tablets on the fifth day, and 1 tablet on the last day. 21 tablet Flossie Dibble, NP   promethazine-dextromethorphan (PROMETHAZINE-DM) 6.25-15 MG/5ML syrup Take 5 mLs by mouth at bedtime. 35 mL Flossie Dibble, NP      PDMP not reviewed this encounter.   Flossie Dibble, NP 01/04/22 [phone]

## 2022-02-03 ENCOUNTER — Other Ambulatory Visit: Payer: Self-pay | Admitting: Nurse Practitioner

## 2022-02-03 DIAGNOSIS — N920 Excessive and frequent menstruation with regular cycle: Secondary | ICD-10-CM

## 2022-02-03 DIAGNOSIS — N939 Abnormal uterine and vaginal bleeding, unspecified: Secondary | ICD-10-CM

## 2022-02-06 NOTE — Telephone Encounter (Signed)
Med refill request:Slynd '4mg'$  tab Last AEX: 09/27/21 /TW Next AEX: Not scheduled  Last MMG (if hormonal med) N/A  Last Rx sent 08/24/21 #84/1RF  Rx pended #84/ 2RF Refill authorized: Please Advise?

## 2022-05-13 IMAGING — US US ABDOMEN COMPLETE
1 series · 14 of 25 positions shown · non-contrast
Comparison: CT 09/16/2017

CLINICAL DATA: Upper abdominal pain

EXAM:
ABDOMEN ULTRASOUND COMPLETE

[Series 1: us abdomen complete · 14 of 100 slices shown]
[im 1/100]
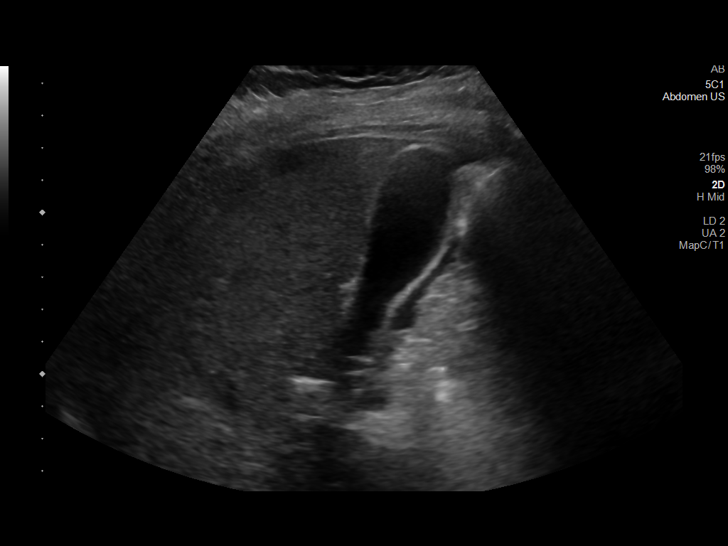
[im 9/100]
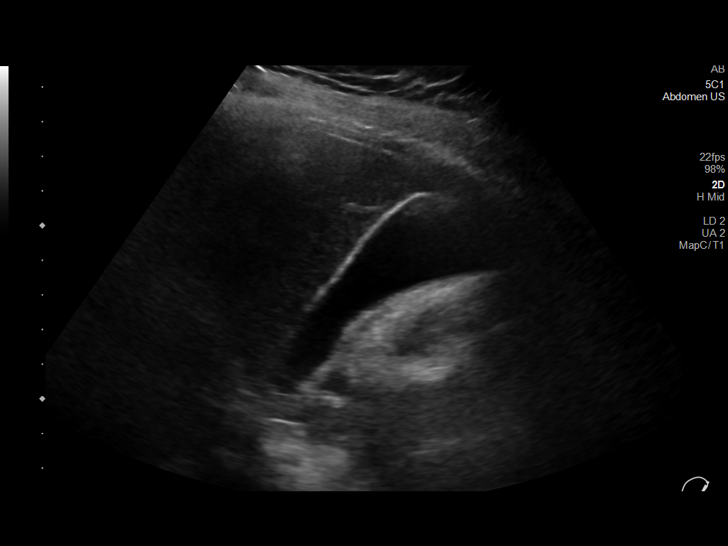
[im 17/100]
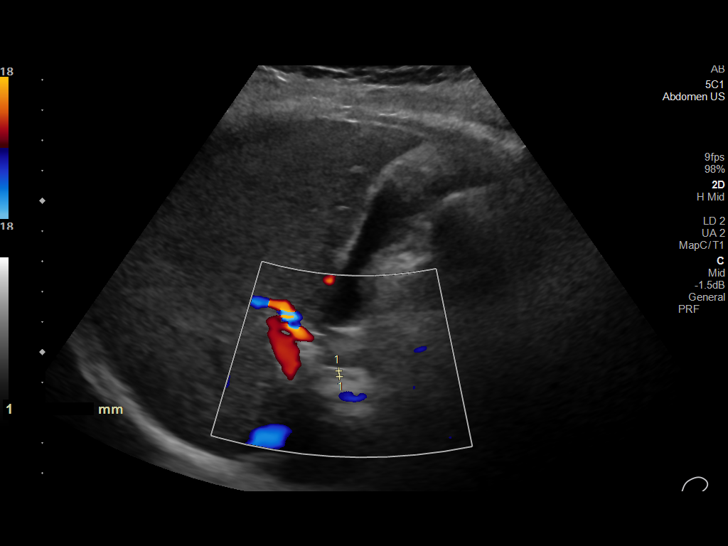
[im 25/100]
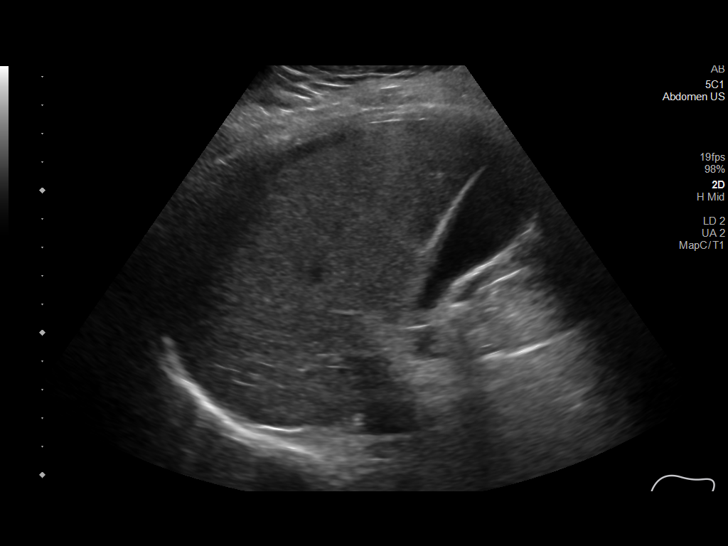
[im 34/100]
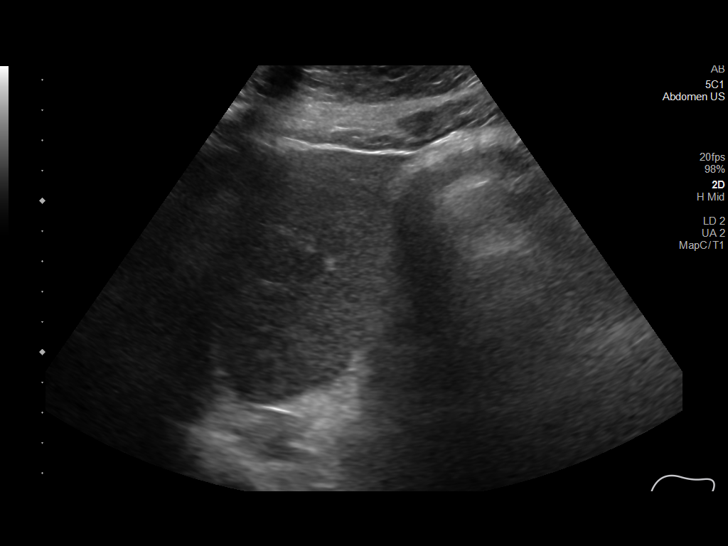
[im 38/100]
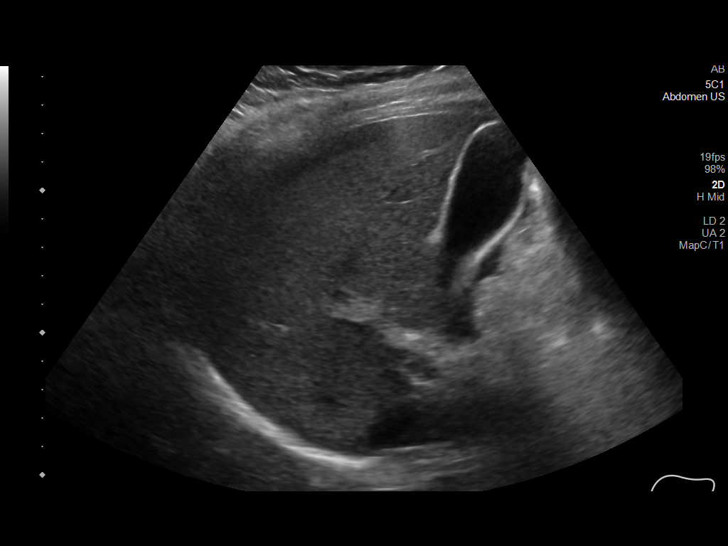
[im 46/100]
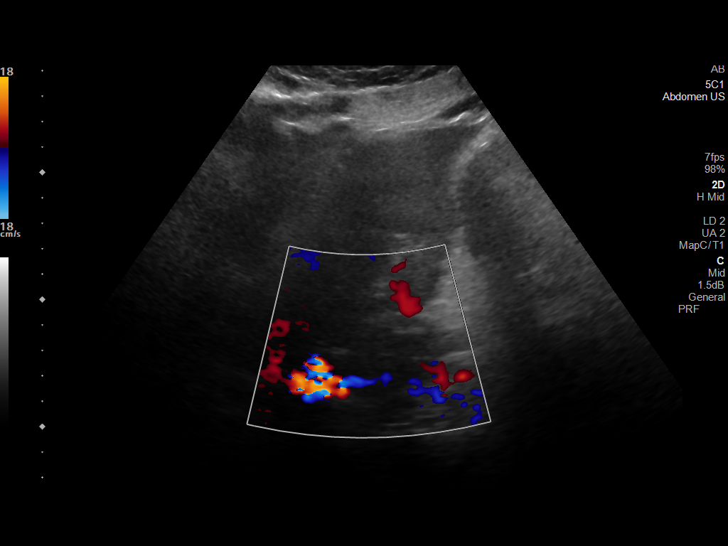
[im 54/100]
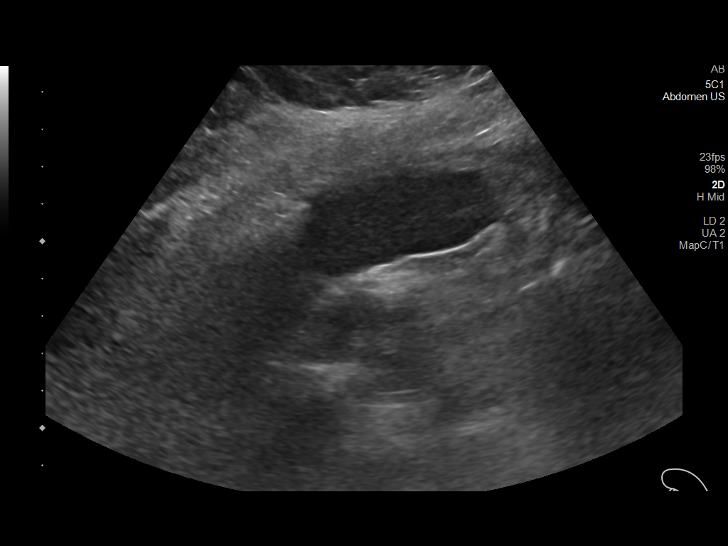
[im 62/100]
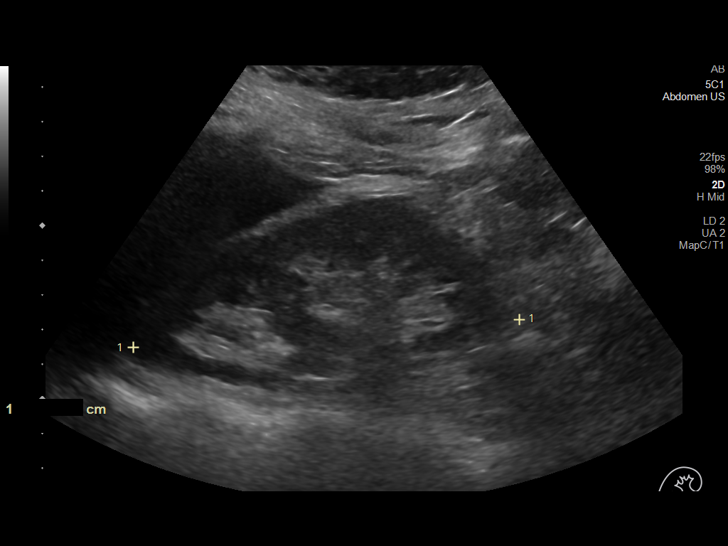
[im 67/100]
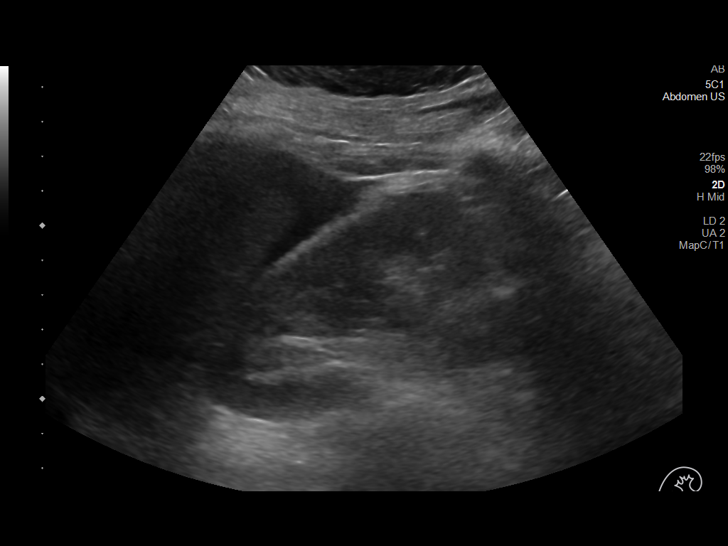
[im 75/100]
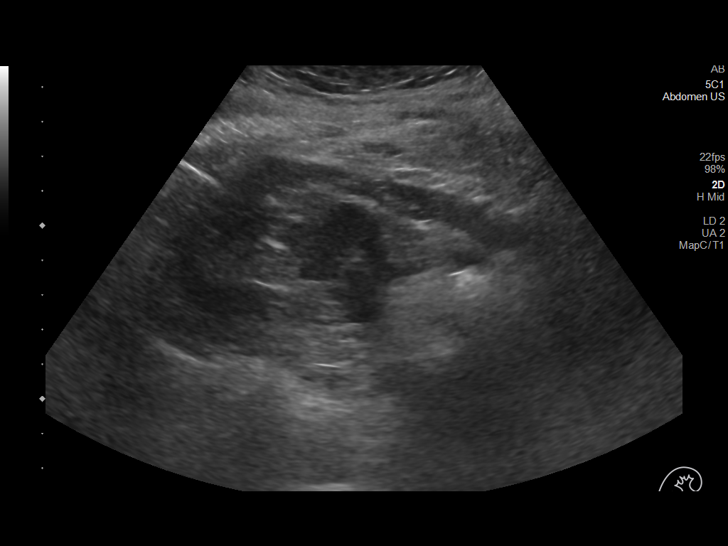
[im 83/100]
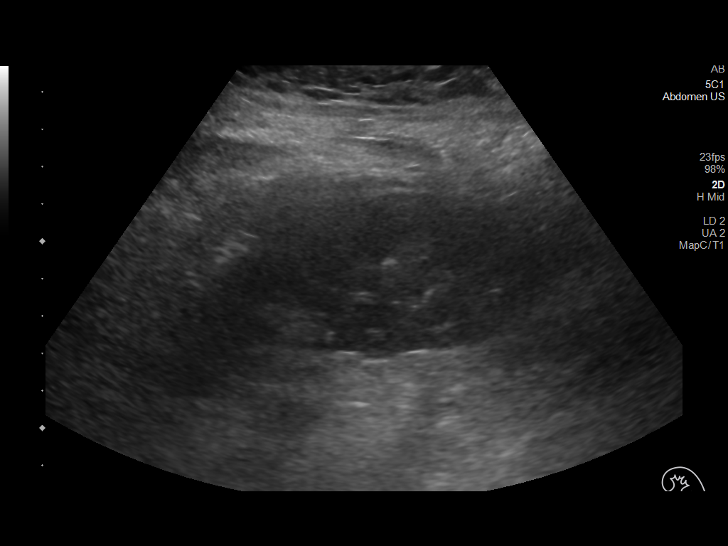
[im 91/100]
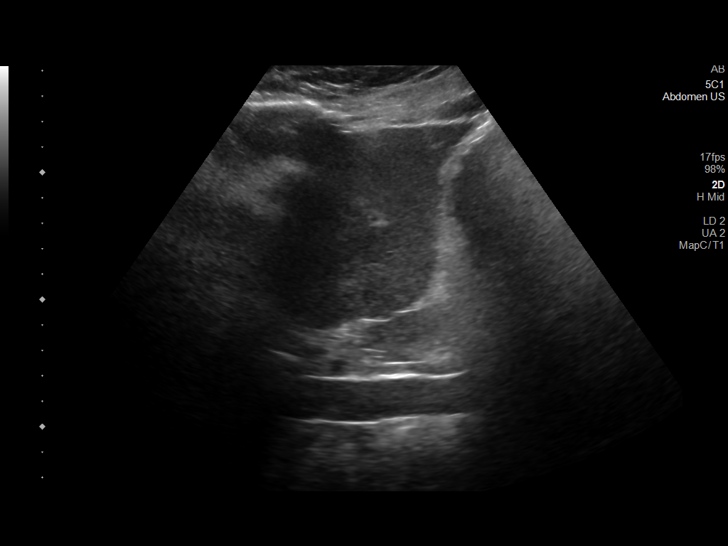
[im 100/100]
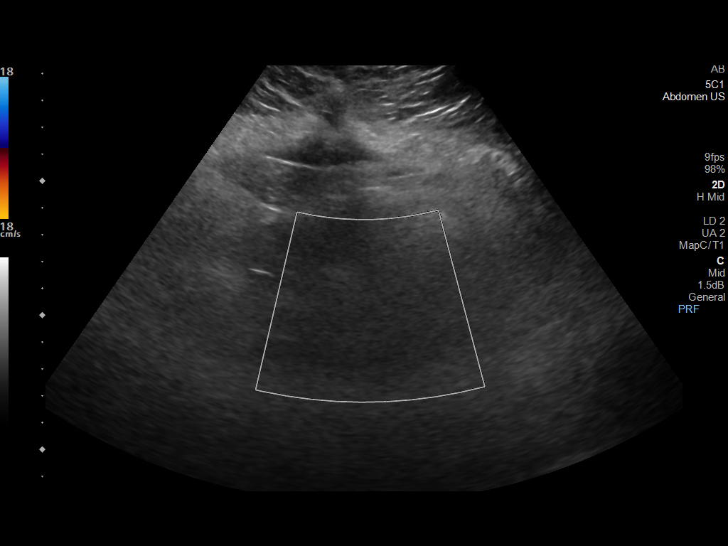

[14 of 25 positions shown; findings below may reference images not displayed]

FINDINGS: Gallbladder: No gallstones or wall thickening visualized. No
sonographic Murphy sign noted by sonographer.

Common bile duct: Diameter: 2.1 mm

Liver: No focal lesion identified. Within normal limits in
parenchymal echogenicity. Portal vein is patent on color Doppler
imaging with normal direction of blood flow towards the liver.

IVC: No abnormality visualized.

Pancreas: Not visualized due to bowel gas

Spleen: Size and appearance within normal limits.

Right Kidney: Length: 11.2 cm. Echogenicity within normal limits. No
mass or hydronephrosis visualized.

Left Kidney: Length: 10.6 cm. Echogenicity within normal limits. No
mass or hydronephrosis visualized.

Abdominal aorta: Largely obscured by bowel gas.

Other findings: Small moderate ascites within the right upper
quadrant. Small amount of ascites within the left lower quadrant.
IMPRESSION: 1. Negative for gallstones.
2. Pancreas and aorta largely obscured by bowel gas
3. Small moderate ascites in the right upper quadrant of unclear
source

## 2022-05-13 IMAGING — US US OB COMP LESS 14 WK
1 series · 6 of 6 positions shown · non-contrast
Comparison: None.

CLINICAL DATA: Pregnant patient with right lower quadrant and
abdominal pain. Pregnancy of unknown location.

EXAM:
OBSTETRIC <14 WK ULTRASOUND
TECHNIQUE: Transabdominal ultrasound was performed for evaluation of the
gestation as well as the maternal uterus and adnexal regions.

[Series 1: us ob comp less 14 wk · 6 acquisitions, 6 frames shown]
[im 1/6]
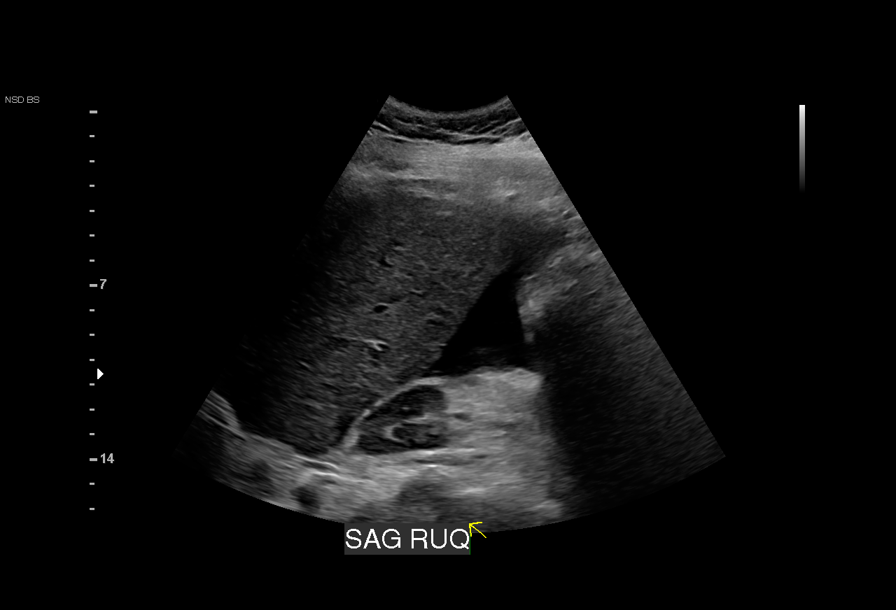
[im 2/6]
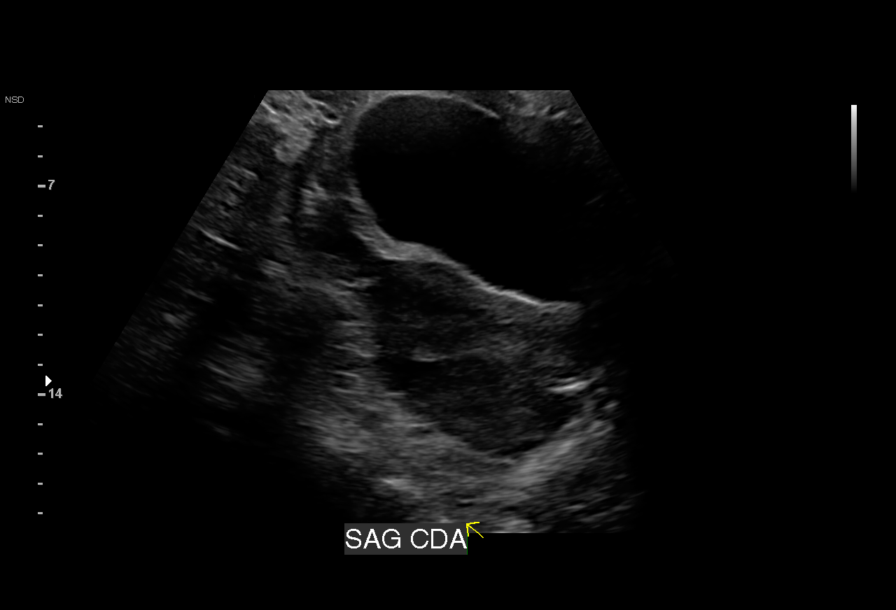
[im 3/6]
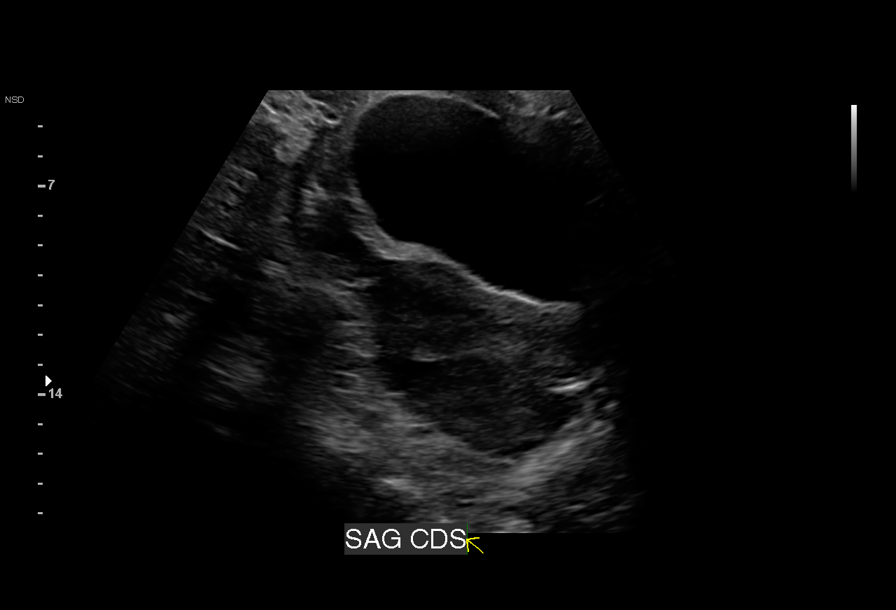
[im 4/6]
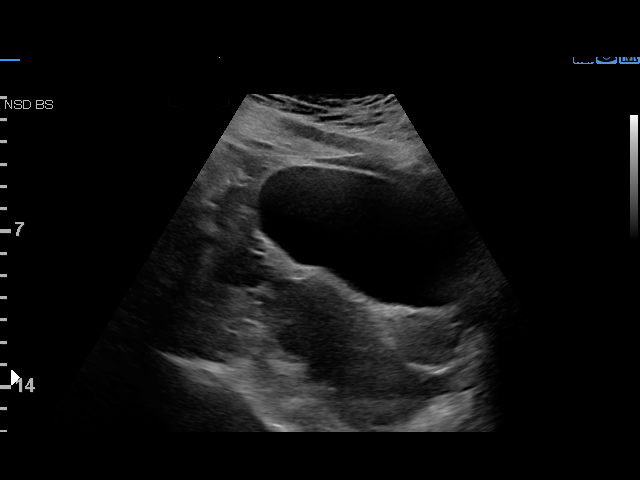
[im 5/6]
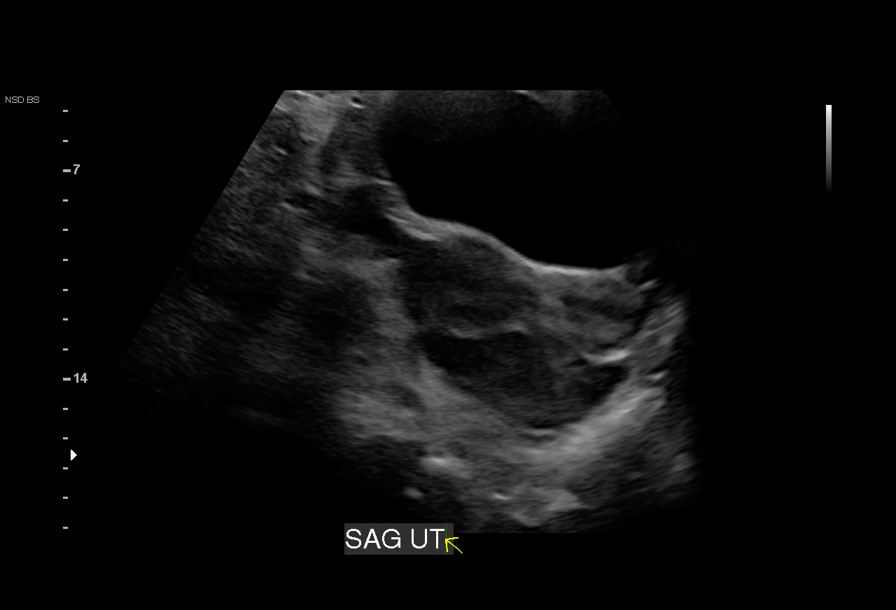
[im 6/6]
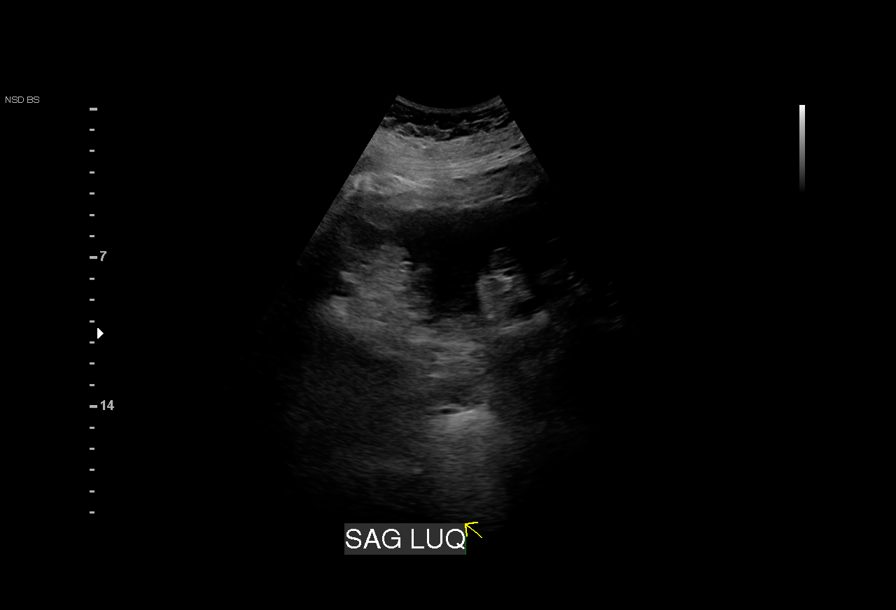

[6 of 6 positions shown; findings below may reference images not displayed]

FINDINGS: Intrauterine gestational sac: None

Yolk sac:  Not Visualized.

Embryo:  Not Visualized.

Maternal uterus/adnexae: Only 5 images were submitted for
interpretation. There is no intrauterine gestational sac. The
endometrium appears thin approximately 4 mm, though not discretely
measured. There is a large amount of complex fluid in the
cul-de-sac, with free fluid in both right and left upper quadrants.
No adnexal masses demonstrated on provided images. The ovaries were
not delineated.

Technologist reviewed images with attending surgeon prior to patient
proceeding to the operating room, and additional images were not
requested.
IMPRESSION: 1. No intrauterine pregnancy.
2. Large amount of complex fluid in the pelvis, with free fluid
tracking into the right and left upper quadrant. Findings consistent
with ruptured ectopic pregnancy.

These results were called by telephone at the time of interpretation
acknowledged these results.

## 2022-08-01 ENCOUNTER — Encounter: Payer: Self-pay | Admitting: Nurse Practitioner

## 2022-08-02 NOTE — Telephone Encounter (Signed)
I think that covers it, remind her to take a daily PNV

## 2022-08-25 LAB — OB RESULTS CONSOLE HIV ANTIBODY (ROUTINE TESTING): HIV: NONREACTIVE

## 2022-08-25 LAB — OB RESULTS CONSOLE RUBELLA ANTIBODY, IGM: Rubella: IMMUNE

## 2022-08-25 LAB — OB RESULTS CONSOLE HEPATITIS B SURFACE ANTIGEN: Hepatitis B Surface Ag: NEGATIVE

## 2022-08-25 LAB — OB RESULTS CONSOLE RPR: RPR: NONREACTIVE

## 2022-12-26 ENCOUNTER — Inpatient Hospital Stay (HOSPITAL_COMMUNITY)
Admission: AD | Admit: 2022-12-26 | Discharge: 2022-12-27 | DRG: 833 | Disposition: A | Discharge status: Home / Self Care | Payer: BC Managed Care – PPO | Attending: Obstetrics and Gynecology | Admitting: Obstetrics and Gynecology

## 2022-12-26 ENCOUNTER — Inpatient Hospital Stay (HOSPITAL_COMMUNITY): Payer: BC Managed Care – PPO

## 2022-12-26 ENCOUNTER — Other Ambulatory Visit: Payer: Self-pay

## 2022-12-26 ENCOUNTER — Encounter (HOSPITAL_COMMUNITY): Payer: Self-pay | Admitting: *Deleted

## 2022-12-26 DIAGNOSIS — O4693 Antepartum hemorrhage, unspecified, third trimester: Secondary | ICD-10-CM | POA: Diagnosis present

## 2022-12-26 DIAGNOSIS — O4692 Antepartum hemorrhage, unspecified, second trimester: Secondary | ICD-10-CM | POA: Diagnosis not present

## 2022-12-26 DIAGNOSIS — O368320 Maternal care for abnormalities of the fetal heart rate or rhythm, second trimester, not applicable or unspecified: Secondary | ICD-10-CM | POA: Diagnosis present

## 2022-12-26 DIAGNOSIS — Z888 Allergy status to other drugs, medicaments and biological substances status: Secondary | ICD-10-CM | POA: Diagnosis not present

## 2022-12-26 DIAGNOSIS — Z8249 Family history of ischemic heart disease and other diseases of the circulatory system: Secondary | ICD-10-CM

## 2022-12-26 DIAGNOSIS — Z823 Family history of stroke: Secondary | ICD-10-CM | POA: Diagnosis not present

## 2022-12-26 DIAGNOSIS — Z91013 Allergy to seafood: Secondary | ICD-10-CM | POA: Diagnosis not present

## 2022-12-26 DIAGNOSIS — Z23 Encounter for immunization: Secondary | ICD-10-CM | POA: Diagnosis not present

## 2022-12-26 DIAGNOSIS — Z6791 Unspecified blood type, Rh negative: Secondary | ICD-10-CM

## 2022-12-26 DIAGNOSIS — Z833 Family history of diabetes mellitus: Secondary | ICD-10-CM | POA: Diagnosis not present

## 2022-12-26 DIAGNOSIS — O26892 Other specified pregnancy related conditions, second trimester: Secondary | ICD-10-CM | POA: Diagnosis present

## 2022-12-26 DIAGNOSIS — O4452 Low lying placenta with hemorrhage, second trimester: Secondary | ICD-10-CM | POA: Diagnosis present

## 2022-12-26 DIAGNOSIS — Z3A26 26 weeks gestation of pregnancy: Secondary | ICD-10-CM

## 2022-12-26 LAB — PROTIME-INR
INR: 0.9 (ref 0.8–1.2)
Prothrombin Time: 12.7 s (ref 11.4–15.2)

## 2022-12-26 LAB — COMPREHENSIVE METABOLIC PANEL
ALT: 17 U/L (ref 0–44)
AST: 20 U/L (ref 15–41)
Albumin: 2.9 g/dL — ABNORMAL LOW (ref 3.5–5.0)
Alkaline Phosphatase: 92 U/L (ref 38–126)
Anion gap: 11 (ref 5–15)
BUN: 9 mg/dL (ref 6–20)
CO2: 21 mmol/L — ABNORMAL LOW (ref 22–32)
Calcium: 8.6 mg/dL — ABNORMAL LOW (ref 8.9–10.3)
Chloride: 103 mmol/L (ref 98–111)
Creatinine, Ser: 0.48 mg/dL (ref 0.44–1.00)
GFR, Estimated: >60 mL/min (ref >60)
Glucose, Bld: 102 mg/dL — ABNORMAL HIGH (ref 70–99)
Potassium: 3.6 mmol/L (ref 3.5–5.1)
Sodium: 135 mmol/L (ref 135–145)
Total Bilirubin: 0.4 mg/dL (ref <1.2)
Total Protein: 6.7 g/dL (ref 6.5–8.1)

## 2022-12-26 LAB — CBC
HCT: 40.4 % (ref 36.0–46.0)
Hemoglobin: 13.2 g/dL (ref 12.0–15.0)
MCH: 30.3 pg (ref 26.0–34.0)
MCHC: 32.7 g/dL (ref 30.0–36.0)
MCV: 92.7 fL (ref 80.0–100.0)
Platelets: 428 10*3/uL — ABNORMAL HIGH (ref 150–400)
RBC: 4.36 MIL/uL (ref 3.87–5.11)
RDW: 13.8 % (ref 11.5–15.5)
WBC: 13.3 10*3/uL — ABNORMAL HIGH (ref 4.0–10.5)
nRBC: 0 % (ref 0.0–0.2)

## 2022-12-26 LAB — WET PREP, GENITAL
Clue Cells Wet Prep HPF POC: NONE SEEN
Sperm: NONE SEEN
Trich, Wet Prep: NONE SEEN
WBC, Wet Prep HPF POC: <10 (ref <10)
Yeast Wet Prep HPF POC: NONE SEEN

## 2022-12-26 LAB — TYPE AND SCREEN
ABO/RH(D): AB NEG
Antibody Screen: NEGATIVE

## 2022-12-26 LAB — APTT: aPTT: 26 s (ref 24–36)

## 2022-12-26 MED ORDER — RHO D IMMUNE GLOBULIN 1500 UNIT/2ML IJ SOSY
300.0000 ug | PREFILLED_SYRINGE | Freq: Once | INTRAMUSCULAR | Status: DC
  Filled 2022-12-26: qty 2

## 2022-12-26 MED ORDER — CALCIUM CARBONATE ANTACID 500 MG PO CHEW: 2.0000 | CHEWABLE_TABLET | ORAL | Status: DC | PRN

## 2022-12-26 MED ORDER — DOCUSATE SODIUM 100 MG PO CAPS
100.0000 mg | ORAL_CAPSULE | Freq: Every day | ORAL | Status: DC
Start: 2022-12-27 — End: 2022-12-28

## 2022-12-26 MED ORDER — SODIUM CHLORIDE 0.9 % IV SOLN: 250.0000 mL | INTRAVENOUS | Status: AC | PRN

## 2022-12-26 MED ORDER — ACETAMINOPHEN 325 MG PO TABS
650.0000 mg | ORAL_TABLET | ORAL | Status: DC | PRN
  Administered 2022-12-26: 650 mg via ORAL
  Filled 2022-12-26: qty 2

## 2022-12-26 MED ORDER — RHO D IMMUNE GLOBULIN 1500 UNIT/2ML IJ SOSY
300.0000 ug | PREFILLED_SYRINGE | Freq: Once | INTRAMUSCULAR | Status: AC
  Administered 2022-12-26: 300 ug via INTRAVENOUS
  Filled 2022-12-26: qty 2

## 2022-12-26 MED ORDER — PRENATAL MULTIVITAMIN CH: 1.0000 | ORAL_TABLET | Freq: Every day | ORAL | Status: DC

## 2022-12-26 MED ORDER — SODIUM CHLORIDE 0.9% FLUSH: 3.0000 mL | INTRAVENOUS | Status: DC | PRN

## 2022-12-26 MED ORDER — SODIUM CHLORIDE 0.9% FLUSH
3.0000 mL | Freq: Two times a day (BID) | INTRAVENOUS | Status: DC
  Administered 2022-12-26 – 2022-12-27 (×2): 3 mL via INTRAVENOUS

## 2022-12-26 MED ORDER — BETAMETHASONE SOD PHOS & ACET 6 (3-3) MG/ML IJ SUSP
12.0000 mg | INTRAMUSCULAR | Status: AC
  Administered 2022-12-26 – 2022-12-27 (×2): 12 mg via INTRAMUSCULAR
  Filled 2022-12-26: qty 5

## 2022-12-26 NOTE — MAU Provider Note (Signed)
[facility]     CSN: [id number] Arrival date and time: 12/26/22 1743   Event Date/Time   First Provider Initiated Contact with Patient 12/26/22 1817      Chief Complaint  Patient presents with   Vaginal Bleeding   Leslie Guerrero is 32 y.o. G2P0010 [redacted]w[redacted]d here with complaints of vaginal bleeding that occurred at at 1700 PM today. Baby is moving. She reports a history of low lying placenta. This is her first bleeding event.   +FM, denies LOF, contractions, vaginal discharge.  Vaginal Bleeding Pertinent negatives include no abdominal pain, back pain, chills, diarrhea, dysuria, fever, flank pain, nausea, rash, sore throat or vomiting.    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   platelet count 531,000    Past Surgical History:  Procedure Laterality Date   DIAGNOSTIC LAPAROSCOPY WITH REMOVAL OF ECTOPIC PREGNANCY N/A 10/17/2020   Procedure: DIAGNOSTIC LAPAROSCOPY WITH REMOVAL OF ECTOPIC PREGNANCY;  Surgeon: Genia Del, MD;  Location[facility];  Service: Gynecology;  Laterality: N/A;   Fatty mass removed     LAPAROSCOPIC UNILATERAL SALPINGECTOMY Right 10/17/2020   Procedure: LAPAROSCOPIC RIGHT SALPINGECTOMY, EVACUATION OF HEMOPERITONEUM;  Surgeon: Genia Del, MD;  Location: [facility];  Service: Gynecology;  Laterality: Right;   Nexplanon insertion  04/25/2016    Family History  Problem Relation Age of Onset   Hypertension Mother    Diabetes Mother    Fibromyalgia Mother    Migraines Mother    Hypertension Father    Heart attack Father    Diabetes Paternal Uncle    Breast cancer Paternal Grandmother    Diabetes Paternal Grandfather    Stroke Paternal Grandfather     Social History   Tobacco Use   Smoking  status: Never   Smokeless tobacco: Never  Vaping Use   Vaping status: Never Used  Substance Use Topics   Alcohol use: Not Currently    Alcohol/week: 1.0 standard drink of alcohol    Types: 1 Standard drinks or equivalent per week    Comment: socially   Drug use: No    Allergies:  Allergies  Allergen Reactions   Fluoxetine Other (See Comments)    "Gave me a headache and I felt badly"   Lavender Oil Hives   Shellfish-Derived Products Hives, Itching and Other (See Comments)    Sweating, also (no breathing issues, however)   Nickel Rash    Medications Prior to Admission  Medication Sig Dispense Refill Last Dose   cyclobenzaprine (FLEXERIL) 10 MG tablet Take 10 mg by mouth 3 (three) times daily as needed for muscle spasms.      EXCEDRIN MIGRAINE 250-250-65 MG tablet Take 1-2 tablets by mouth every 6 (six) hours as needed for headache or migraine.      predniSONE (DELTASONE) 10 MG tablet Take 6 tablets on the first day, 5 tablets on the second day, 4 tablets on the third day, 3 tablets on the fourth day, 2 tablets on  the fifth day, and 1 tablet on the last day. 21 tablet 0    promethazine-dextromethorphan (PROMETHAZINE-DM) 6.25-15 MG/5ML syrup Take 5 mLs by mouth at bedtime. 35 mL 0    SLYND 4 MG TABS TAKE ONE (1) TABLET BY MOUTH DAILY. 84 tablet 2     Review of Systems  Constitutional:  Negative for chills and fever.  HENT:  Negative for congestion and sore throat.   Eyes:  Negative for pain and visual disturbance.  Respiratory:  Negative for cough, chest tightness and shortness of breath.   Cardiovascular:  Negative for chest pain.  Gastrointestinal:  Negative for abdominal pain, diarrhea, nausea and vomiting.  Endocrine: Negative for cold intolerance and heat intolerance.  Genitourinary:  Positive for vaginal bleeding. Negative for dysuria and flank pain.  Musculoskeletal:  Negative for back pain.  Skin:  Negative for rash.  Allergic/Immunologic: Negative for food allergies.   Neurological:  Negative for dizziness and light-headedness.  Psychiatric/Behavioral:  Negative for agitation.    Physical Exam   Blood pressure 124/69, pulse (!) 112, temperature 97.6 F (36.4 C), temperature source Oral, resp. rate 17, height 5\' 2"  (1.575 m), weight 104.1 kg, last menstrual period 06/23/2022, SpO2 100%.  Physical Exam Vitals and nursing note reviewed.  Constitutional:      General: She is not in acute distress.    Appearance: She is well-developed.     Comments: Pregnant female  HENT:     Head: Normocephalic and atraumatic.  Eyes:     General: No scleral icterus.    Conjunctiva/sclera: Conjunctivae normal.  Cardiovascular:     Rate and Rhythm: Normal rate.  Pulmonary:     Effort: Pulmonary effort is normal.  Chest:     Chest wall: No tenderness.  Abdominal:     Palpations: Abdomen is soft.     Tenderness: There is no abdominal tenderness. There is no guarding or rebound.     Comments: Gravid  Genitourinary:    Vagina: Normal.  Musculoskeletal:        General: Normal range of motion.     Cervical back: Normal range of motion and neck supple.  Skin:    General: Skin is warm and dry.     Findings: No rash.  Neurological:     Mental Status: She is alert and oriented to person, place, and time.     MAU Course  Procedures I reviewed the patient's fetal monitoring.  Baseline HR: 120 Variability:  moderate Accels:present - 10x10 Decels: none  A/P: Reassured regarding fetal status.   MDM: high  This patient presents to the ED for concern of   Chief Complaint  Patient presents with   Vaginal Bleeding     These complains involves an extensive number of treatment options, and is a complaint that carries with it a high risk of complications and morbidity.  The differential diagnosis for  1.vaginal bleeding INCLUDED second trimester loss, abruption, low lying placenta    Co morbidities that complicate the patient evaluation: Patient Active  Problem List   Diagnosis Date Noted   Low lying placenta with hemorrhage, second trimester 12/26/2022   Vaginal bleeding in pregnancy, second trimester 12/26/2022   Postoperative state 10/17/2020   Migraines 05/21/2018   Inappropriate sinus tachycardia (HCC) 09/16/2017   Laceration of right upper arm 09/16/2017    Additional history obtained from partner  Interpreter services used: no  External records from outside source obtained and reviewed including CareEverywhere and Prenatal care records  Lab Tests: CMP,  CBC, and Other PTT INR  I ordered, and personally interpreted labs.  The pertinent results include:   Results for orders placed or performed during the hospital encounter of 12/26/22 (from the past 24 hour(s))  Type and screen [facility]     Status: None   Collection Time: 12/26/22  6:03 PM  Result Value Ref Range   ABO/RH(D) AB NEG    Antibody Screen NEG    Sample Expiration      12/29/2022,2359 Performed at [facility], [address] [place], [place] [address]   CBC     Status: Abnormal   Collection Time: 12/26/22  6:07 PM  Result Value Ref Range   WBC 13.3 (H) 4.0 - 10.5 K/uL   RBC 4.36 3.87 - 5.11 MIL/uL   Hemoglobin 13.2 12.0 - 15.0 g/dL   HCT 95.2 84.1 - 32.4 %   MCV 92.7 80.0 - 100.0 fL   MCH 30.3 26.0 - 34.0 pg   MCHC 32.7 30.0 - 36.0 g/dL   RDW 40.1 02.7 - 25.3 %   Platelets 428 (H) 150 - 400 K/uL   nRBC 0.0 0.0 - 0.2 %  Comprehensive metabolic panel     Status: Abnormal   Collection Time: 12/26/22  6:07 PM  Result Value Ref Range   Sodium 135 135 - 145 mmol/L   Potassium 3.6 3.5 - 5.1 mmol/L   Chloride 103 98 - 111 mmol/L   CO2 21 (L) 22 - 32 mmol/L   Glucose, Bld 102 (H) 70 - 99 mg/dL   BUN 9 6 - 20 mg/dL   Creatinine, Ser 6.64 0.44 - 1.00 mg/dL   Calcium 8.6 (L) 8.9 - 10.3 mg/dL   Total Protein 6.7 6.5 - 8.1 g/dL   Albumin 2.9 (L) 3.5 - 5.0 g/dL   AST 20 15 - 41 U/L   ALT 17 0 - 44 U/L   Alkaline Phosphatase 92 38 -  126 U/L   Total Bilirubin 0.4 <1.2 mg/dL   GFR, Estimated >40 >34 mL/min   Anion gap 11 5 - 15  APTT     Status: None   Collection Time: 12/26/22  6:07 PM  Result Value Ref Range   aPTT 26 24 - 36 seconds  Protime-INR     Status: None   Collection Time: 12/26/22  6:07 PM  Result Value Ref Range   Prothrombin Time 12.7 11.4 - 15.2 seconds   INR 0.9 0.8 - 1.2  Wet prep, genital     Status: None   Collection Time: 12/26/22  7:27 PM   Specimen: Vaginal/Rectal  Result Value Ref Range   Yeast Wet Prep HPF POC NONE SEEN NONE SEEN   Trich, Wet Prep NONE SEEN NONE SEEN   Clue Cells Wet Prep HPF POC NONE SEEN NONE SEEN   WBC, Wet Prep HPF POC <10 <10   Sperm NONE SEEN   Rh IG workup (includes ABO/Rh)     Status: None (Preliminary result)   Collection Time: 12/26/22  8:28 PM  Result Value Ref Range   Gestational Age(Wks) 26    Unit Number [id number]    Blood Component Type RHIG    Unit division 00    Status of Unit ALLOCATED    Transfusion Status      OK TO TRANSFUSE Performed at [facility], [address] [place], [place] [address]      Imaging Studies ordered:  I ordered imaging studies includingOther Limited US  to assess for abruption I independently visualized and interpreted imaging which showed Anterior placenta with low lying 1.9cm from internal os I agree with the radiologist interpretation   MAU Course: Discussed with on call physician Dr. Clint Lipps. She will come to admit patient.   After the interventions noted above, I reevaluated the patient and found that they have :improved  Dispostion: admitted to the hospital   Assessment and Plan  Admit to [facility] specialty Labs draw for admission Stable for admission, no need for emergent delivery.   Isa Rankin [facility] 12/26/2022, 8:41 PM

## 2022-12-26 NOTE — MAU Note (Signed)
Sent from [facility], pt is having bleeding. Known low-lying placenta.  Denies any pain. Reports sporadic fetal movement.  Pt taken directly to rm

## 2022-12-26 NOTE — H&P (Signed)
Leslie Guerrero is a 32 y.o. G2P0010 female at [redacted]w[redacted]d with a pregnancy complicated by low lying placenta presenting for heavy vaginal bleeding that occurred at approximately 5pm today. She reports she went to the bathroom and noticed that blood had soaked her underwear and that the toilet was full of blood. She called the after hours line and was instructed to come directly to MAU. On presentation, she denies any other symptoms, including vaginal irritation, abnormal vaginal discharge, and abdominal pain. She has not had intercourse recently. She reports good fetal movement and denies LOF or contractions. She reports that she has no other symptoms aside from a slight headache developing since she hasn't eaten since lunch.  Her pregnancy is otherwise complicated by: -Rh neg -POTS -migraines -h/o ectopic pregnancy s/p RSO in 2022 OB History     Gravida  2   Para      Term      Preterm      AB  1   Living         SAB      IAB      Ectopic  1   Multiple      Live Births             Past Medical History:  Diagnosis Date   Migraines    POTS (postural orthostatic tachycardia syndrome)    Thrombocytosis 05/2011   platelet count 531,000   Past Surgical History:  Procedure Laterality Date   DIAGNOSTIC LAPAROSCOPY WITH REMOVAL OF ECTOPIC PREGNANCY N/A 10/17/2020   Procedure: DIAGNOSTIC LAPAROSCOPY WITH REMOVAL OF ECTOPIC PREGNANCY;  Surgeon: Genia Del, MD;  Location: [facility];  Service: Gynecology;  Laterality: N/A;   Fatty mass removed     LAPAROSCOPIC UNILATERAL SALPINGECTOMY Right 10/17/2020   Procedure: LAPAROSCOPIC RIGHT SALPINGECTOMY, EVACUATION OF HEMOPERITONEUM;  Surgeon: Genia Del, MD;  Location: [facility];  Service: Gynecology;  Laterality: Right;   Nexplanon insertion  04/25/2016   Family History: family history includes Breast cancer in her paternal grandmother; Diabetes in her mother, paternal grandfather, and paternal uncle; Fibromyalgia in her  mother; Heart attack in her father; Hypertension in her father and mother; Migraines in her mother; Stroke in her paternal grandfather. Social History:  reports that she has never smoked. She has never used smokeless tobacco. She reports that she does not currently use alcohol after a past usage of about 1.0 standard drink of alcohol per week. She reports that she does not use drugs.     Maternal Diabetes: No Genetic Screening: Normal Maternal Ultrasounds/Referrals: Other: low lying placenta Fetal Ultrasounds or other Referrals:  None Maternal Substance Abuse:  No Significant Maternal Medications:  None Significant Maternal Lab Results:  Rh negative Number of Prenatal Visits:greater than 3 verified prenatal visits Maternal Vaccinations:none Other Comments:  None  Review of Systems  All other systems reviewed and are negative.  Maternal Medical History:  Reason for admission: Vaginal bleeding.   Fetal activity: Perceived fetal activity is normal.   Last perceived fetal movement was within the past hour.   Prenatal complications: Bleeding and placental abnormality.   No cholelithiasis, HIV, PIH, infection, IUGR, nephrolithiasis, oligohydramnios, polyhydramnios, pre-eclampsia, preterm labor, substance abuse, thrombocytopenia or thrombophilia.   Prenatal Complications - Diabetes: none.     Blood pressure 114/74, pulse (!) 115, temperature 98.3 F (36.8 C), temperature source Oral, resp. rate 20, height 5\' 2"  (1.575 m), weight 104.1 kg, last menstrual period 01/03/2022, SpO2 98%. Maternal Exam:  Abdomen: Patient reports no abdominal tenderness.  Fetal presentation: vertex Introitus: Normal vulva. Normal vagina.  Ferning test: not done.  Nitrazine test: not done. Cervix: Cervix evaluated by sterile speculum exam.     Fetal Exam Fetal Monitor Review: Mode: ultrasound.   Baseline rate: 140.  Variability: moderate (6-25 bpm).   Pattern: accelerations present and no decelerations.    Significant periods of broken FHR tracing Fetal State Assessment: Category I - tracings are normal.   Physical Exam Vitals reviewed.  Constitutional:      General: She is not in acute distress. HENT:     Head: Normocephalic and atraumatic.  Eyes:     Extraocular Movements: Extraocular movements intact.  Cardiovascular:     Rate and Rhythm: Regular rhythm. Tachycardia present.     Heart sounds: No murmur heard.    No friction rub. No gallop.  Pulmonary:     Effort: Pulmonary effort is normal. No respiratory distress.     Breath sounds: Normal breath sounds. No stridor. No wheezing, rhonchi or rales.  Chest:     Chest wall: No tenderness.  Abdominal:     Tenderness: There is no abdominal tenderness. There is no guarding.     Comments: gravid  Genitourinary:    General: Normal vulva.     Comments: Sterile speculum exam: No blood in the vagina. Small amount of blood on the cervix, able to be cleared with a large swab. Cervix appears closed with a small amount of blood at the os, no continued bleeding noted. Musculoskeletal:        General: Normal range of motion.     Cervical back: Normal range of motion.  Skin:    General: Skin is warm and dry.  Neurological:     General: No focal deficit present.     Mental Status: She is alert and oriented to person, place, and time.  Psychiatric:        Mood and Affect: Mood normal.        Behavior: Behavior normal.        Thought Content: Thought content normal.        Judgment: Judgment normal.     Prenatal labs: ABO, Rh: --/--/AB NEG (12/03 1803) Antibody: NEG (12/03 1803) Rubella:  immune (08/25/22) RPR:   non-reactive (08/25/22) HBsAg:   negative (08/25/22) HIV:   non-reactive (08/25/22) GBS:   collected on admission  Assessment/Plan: Leslie Guerrero is a 32 y.o. G2P0010 female at [redacted]w[redacted]d with a pregnancy complicated by low lying placenta presenting for heavy vaginal bleeding that occurred at approximately 5pm today. -VB in s/o  LLP: Patient reports a concerning amount of bleeding, no ctx. Appears to be stable on presentation, with reassuring fetal heart rate monitoring and ultrasound. Final ultrasound read not yet available, but per prelim read placenta remains low-lying, 1.9cm from internal os. No active bleeding, cervix closed on exam. Hgb 13.2, coags pending. Wet prep unremarkable, G/C and GBS collected and pending. Will admit at least overnight for continuous fetal monitoring and monitoring of bleeding. BMZ ordered. Due to overall stable presentation, will hold off on magnesium for fetal neuroprotection unless pt or fetal status become unstable.  -Rh neg: plan Rhogam.  Evangelina Delancey A Tiondra Fang 12/26/2022, 7:00 PM

## 2022-12-27 LAB — GC/CHLAMYDIA PROBE AMP (~~LOC~~) NOT AT ARMC
Chlamydia: NEGATIVE
Comment: NEGATIVE
Comment: NORMAL
Neisseria Gonorrhea: NEGATIVE

## 2022-12-27 LAB — RH IG WORKUP (INCLUDES ABO/RH)
Gestational Age(Wks): 26
Unit division: 0

## 2022-12-27 LAB — KLEIHAUER-BETKE STAIN
# Vials RhIg: 1
Fetal Cells %: 0 %
Quantitation Fetal Hemoglobin: 0 mL

## 2022-12-27 NOTE — Discharge Summary (Signed)
Antenatal Discharge Summary  Date of Service updated     Patient Name: Leslie Guerrero DOB: January 13, 1991 MRN: [id number]  Date of admission: 12/26/2022 Date of discharge: 12/27/2022  Admitting diagnosis: Vaginal bleeding in pregnancy, second trimester [O46.92] Intrauterine pregnancy: [redacted]w[redacted]d     Secondary diagnosis:  Principal Problem:   Low lying placenta with hemorrhage, second trimester Active Problems:   Vaginal bleeding in pregnancy, second trimester  Additional problems: Rh negative status    Discharge diagnosis:  Vaginal bleeding in second trimester                                                Hospital course: Patient admitted 12/26/22 after large episode of antenatal bleeding in the second trimester. [place] redemonstrated low-lying placenta without evidence of abruption. She received Rhogam in the setting of Rh negative blood type and received a betamethasone course. She had no further episodes of bleeding during her hospitalization and had reassuring fetal monitoring throughout her hospital stay.  Magnesium Sulfate received: No BMZ received: Yes Rhophylac:Yes Transfusion:No Immunizations administered: Immunization History  Administered Date(s) Administered   HPV Quadrivalent 03/29/2011, 06/13/2011   Tdap 09/16/2017    Physical exam  Vitals:   12/26/22 2000 12/27/22 0009 12/27/22 0823 12/27/22 1138  BP: 124/69 107/67 132/76 122/75  Pulse: (!) 112 (!) 108 (!) 129 (!) 119  Resp: 17 16 18 18   Temp:  97.7 F (36.5 C) 98.3 F (36.8 C) 97.9 F (36.6 C)  TempSrc:  Oral Oral Oral  SpO2:  96% 97% 100%  Weight:      Height:       General: alert, cooperative, and no distress Uterine Fundus: soft, non-tender DVT Evaluation: No evidence of DVT seen on physical exam. No significant calf/ankle edema. Labs: Lab Results  Component Value Date   WBC 13.3 (H) 12/26/2022   HGB 13.2 12/26/2022   HCT 40.4 12/26/2022   MCV 92.7 12/26/2022   PLT 428 (H) 12/26/2022       Latest Ref Rng & Units 12/26/2022    6:07 PM  CMP  Glucose 70 - 99 mg/dL 409   BUN 6 - 20 mg/dL 9   Creatinine 8.11 - 9.14 mg/dL 7.82   Sodium 956 - 213 mmol/L 135   Potassium 3.5 - 5.1 mmol/L 3.6   Chloride 98 - 111 mmol/L 103   CO2 22 - 32 mmol/L 21   Calcium 8.9 - 10.3 mg/dL 8.6   Total Protein 6.5 - 8.1 g/dL 6.7   Total Bilirubin <0.8 mg/dL 0.4   Alkaline Phos 38 - 126 U/L 92   AST 15 - 41 U/L 20   ALT 0 - 44 U/L 17    Edinburgh Score:     No data to display            After visit meds:  Allergies as of 12/27/2022       Reactions   Fluoxetine Other (See Comments)   "Gave me a headache and I felt badly"   Lavender Oil Hives   Shellfish-derived Products Hives, Itching, Other (See Comments)   Sweating, also (no breathing issues, however)   Nickel Rash        Medication List     STOP taking these medications    cyclobenzaprine 10 MG tablet Commonly known as: FLEXERIL   Excedrin Migraine 250-250-65 MG  tablet Generic drug: aspirin-acetaminophen-caffeine   predniSONE 10 MG tablet Commonly known as: DELTASONE   promethazine-dextromethorphan 6.25-15 MG/5ML syrup Commonly known as: PROMETHAZINE-DM   Slynd 4 MG Tabs Generic drug: Drospirenone         Discharge home in stable condition Discharge instruction: Strict return precautions Activity: Advance as tolerated. Pelvic rest until follow-up  Diet: routine diet Future Appointments: 01/04/23  Follow up Visit:  Follow-up Information     Truett Perna A, DO. Go on 01/04/2023.   Specialty: Obstetrics and Gynecology Contact information: [address], [place] [address] [place] [address] [phone]                     12/27/2022 Junius Creamer, DO

## 2022-12-27 NOTE — Progress Notes (Signed)
Continuous fetal heart rate monitoring reviewed. Periods of broken tracing, but overall reactive and reassuring (baseline wnl, moderate variability, + accelerations, one variable deceleration at approx 04:10am).

## 2022-12-27 NOTE — Progress Notes (Signed)
Leslie Guerrero is a 33 y.o. G2P0010 at [redacted]w[redacted]d admitted for bleeding in the setting of low lying placenta  Subjective: Doing well this morning. She has not had any further bleeding. After admission, bleeding slowed to brown spotting, and then stopped shortly after. Denies pelvic pain or contractions. +Fms.  Objective: BP 122/75 (BP Location: Right Arm)   Pulse (!) 119   Temp 97.9 F (36.6 C) (Oral)   Resp 18   Ht 5\' 2"  (1.575 m)   Wt 104.1 kg   LMP 06/23/2022 (Approximate)   SpO2 100%   BMI 41.96 kg/m  No intake/output data recorded. No intake/output data recorded.  FHT:  FHR: 140 bpm, variability: moderate,  accelerations:  Present,  decelerations:  Present rare variable UC:   none Fetal wellbeing: reassuring for gestational age  Labs: Lab Results  Component Value Date   WBC 13.3 (H) 12/26/2022   HGB 13.2 12/26/2022   HCT 40.4 12/26/2022   MCV 92.7 12/26/2022   PLT 428 (H) 12/26/2022    Assessment / Plan: 32 yo G2P0010 @ 26.5 HD#2 admitted for antepartum bleeding -Bleeding resolved. S/p BMZ x1 and Rhogam  -Fetal status reassuring. Deescalate to NST q shift - Anticipate DC home this evening after 2nd BMZ   Junius Creamer, DO 12/27/2022, 12:05 PM

## 2022-12-27 NOTE — Progress Notes (Signed)
Continuous fetal heart rate monitoring reviewed. Periods of broken tracing, but overall reactive and reassuring (baseline wnl, moderate variability, + accelerations, no notable decelerations).

## 2022-12-27 NOTE — Discharge Instructions (Signed)
Call office with any concerns [phone] 

## 2022-12-28 LAB — CULTURE, BETA STREP (GROUP B ONLY)

## 2023-01-06 ENCOUNTER — Inpatient Hospital Stay (HOSPITAL_COMMUNITY): Payer: BC Managed Care – PPO

## 2023-01-06 ENCOUNTER — Inpatient Hospital Stay (EMERGENCY_DEPARTMENT_HOSPITAL)
Admission: AD | Admit: 2023-01-06 | Discharge: 2023-01-06 | Disposition: A | Discharge status: Home / Self Care | Payer: BC Managed Care – PPO | Source: Home / Self Care | Attending: Obstetrics and Gynecology | Admitting: Obstetrics and Gynecology

## 2023-01-06 ENCOUNTER — Encounter (HOSPITAL_COMMUNITY): Payer: Self-pay | Admitting: Obstetrics and Gynecology

## 2023-01-06 DIAGNOSIS — O4443 Low lying placenta NOS or without hemorrhage, third trimester: Secondary | ICD-10-CM | POA: Diagnosis not present

## 2023-01-06 DIAGNOSIS — O4693 Antepartum hemorrhage, unspecified, third trimester: Secondary | ICD-10-CM

## 2023-01-06 DIAGNOSIS — O4453 Low lying placenta with hemorrhage, third trimester: Secondary | ICD-10-CM | POA: Insufficient documentation

## 2023-01-06 DIAGNOSIS — Z3A28 28 weeks gestation of pregnancy: Secondary | ICD-10-CM

## 2023-01-06 LAB — CBC
HCT: 36.4 % (ref 36.0–46.0)
Hemoglobin: 12.3 g/dL (ref 12.0–15.0)
MCH: 30.8 pg (ref 26.0–34.0)
MCHC: 33.8 g/dL (ref 30.0–36.0)
MCV: 91 fL (ref 80.0–100.0)
Platelets: 392 10*3/uL (ref 150–400)
RBC: 4 MIL/uL (ref 3.87–5.11)
RDW: 13.9 % (ref 11.5–15.5)
WBC: 13.6 10*3/uL — ABNORMAL HIGH (ref 4.0–10.5)
nRBC: 0 % (ref 0.0–0.2)

## 2023-01-06 NOTE — MAU Provider Note (Signed)
History     CSN: [id number]  Arrival date and time: 01/06/23 1332   Event Date/Time   First Provider Initiated Contact with Patient 01/06/23 1412      Chief Complaint  Patient presents with   Vaginal Bleeding   HPI Leslie Guerrero is a 32 y.o. G2P0010 at [redacted]w[redacted]d who presents for vaginal bleeding. Was admitted 12/3 for bleeding episode in setting of low lying placenta. Received rhogam & BMZ series. Was discharged after 1 day of monitoring & reports no bleeding since then. Episode of bleeding this morning. Noticed dark red blood in toilet with a blood clot & on toilet paper. Since then has turned to brown spotting. Denies abdominal pain, change in activity, or LOF. Reports good fetal movement.   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   platelet count 531,000    Past Surgical History:  Procedure Laterality Date   DIAGNOSTIC LAPAROSCOPY WITH REMOVAL OF ECTOPIC PREGNANCY N/A 10/17/2020   Procedure: DIAGNOSTIC LAPAROSCOPY WITH REMOVAL OF ECTOPIC PREGNANCY;  Surgeon: Genia Del, MD;  Location: [facility];  Service: Gynecology;  Laterality: N/A;   Fatty mass removed     LAPAROSCOPIC UNILATERAL SALPINGECTOMY Right 10/17/2020   Procedure: LAPAROSCOPIC RIGHT SALPINGECTOMY, EVACUATION OF HEMOPERITONEUM;  Surgeon: Genia Del, MD;  Location: [facility];  Service: Gynecology;  Laterality: Right;   Nexplanon insertion  04/25/2016    Family History  Problem Relation Age of Onset   Hypertension Mother    Diabetes Mother    Fibromyalgia Mother    Migraines Mother    Hypertension Father    Heart attack Father    Diabetes Paternal Uncle    Breast cancer Paternal Grandmother    Diabetes Paternal Grandfather    Stroke  Paternal Grandfather     Social History   Tobacco Use   Smoking status: Never   Smokeless tobacco: Never  Vaping Use   Vaping status: Never Used  Substance Use Topics   Alcohol use: Not Currently    Alcohol/week: 1.0 standard drink of alcohol    Types: 1 Standard drinks or equivalent per week    Comment: socially   Drug use: No    Allergies:  Allergies  Allergen Reactions   Fluoxetine Other (See Comments)    "Gave me a headache and I felt badly"   Lavender Oil Hives   Shellfish-Derived Products Hives, Itching and Other (See Comments)    Sweating, also (no breathing issues, however)   Nickel Rash    Medications Prior to Admission  Medication Sig Dispense Refill Last Dose/Taking   acetaminophen (TYLENOL) 325 MG tablet Take 650 mg by mouth every 6 (six) hours as needed for mild pain (pain score 1-3).   Past Week   butalbital-acetaminophen-caffeine (FIORICET) 50-325-40 MG tablet Take 2 tablets by mouth 2 (two) times daily as needed for headache.   Past Week   Prenatal Vit-Fe Fumarate-FA (PRENATAL MULTIVITAMIN) TABS tablet Take 1 tablet by mouth daily  at 12 noon.   01/06/2023    Review of Systems  All other systems reviewed and are negative.  Physical Exam   Blood pressure 121/77, pulse (!) 117, temperature 98 F (36.7 C), temperature source Oral, resp. rate 20, height 5\' 2"  (1.575 m), weight 103 kg, last menstrual period 06/23/2022, SpO2 96%.  Physical Exam Vitals and nursing note reviewed.  Constitutional:      General: She is not in acute distress.    Appearance: She is well-developed. She is not ill-appearing.  HENT:     Head: Normocephalic and atraumatic.  Eyes:     General: No scleral icterus.       Right eye: No discharge.        Left eye: No discharge.     Conjunctiva/sclera: Conjunctivae normal.  Pulmonary:     Effort: Pulmonary effort is normal. No respiratory distress.  Genitourinary:    Comments: Spec exam: NEFG. Dark brown clot in vagina. No active  bleeding from os. Cervix visually closed.  Neurological:     General: No focal deficit present.     Mental Status: She is alert.  Psychiatric:        Mood and Affect: Mood normal.        Behavior: Behavior normal.    NST:  Baseline: 145 bpm, Variability: Good {> 6 bpm), Accelerations: Reactive, and Decelerations: Absent  MAU Course  Procedures Results for orders placed or performed during the hospital encounter of 01/06/23 (from the past 24 hours)  CBC     Status: Abnormal   Collection Time: 01/06/23  3:17 PM  Result Value Ref Range   WBC 13.6 (H) 4.0 - 10.5 K/uL   RBC 4.00 3.87 - 5.11 MIL/uL   Hemoglobin 12.3 12.0 - 15.0 g/dL   HCT 16.1 09.6 - 04.5 %   MCV 91.0 80.0 - 100.0 fL   MCH 30.8 26.0 - 34.0 pg   MCHC 33.8 30.0 - 36.0 g/dL   RDW 40.9 81.1 - 91.4 %   Platelets 392 150 - 400 K/uL   nRBC 0.0 0.0 - 0.2 %   No results found.  MDM RH negative. Received rhogam 12/3 BMZ 12/3 & 12/4  On exam, old blood in vaginal vault. No active bleeding from os. Cervix visually closed. Reactive NST with quiet TOCO & soft/non tender abdomen. Hemoglobin stable.  Prelim ultrasound shows anterior placenta, images indicate 2.2 cm from cervix (per sonographer measurement).   Reviewed with Dr. Mindi Slicker who offers overnight admission for monitoring vs discharge with close follow up.  Discussed results & recommendations with patient & her spouse. With normal placenta location, could be concealed abruption. Aware of fetal & maternal risks of bleeding episodes & abruption. This is her 2nd bleeding episode, notified (per Dr. Mindi Slicker) that the recommendation will be for admission until delivery with a third bleeding episode.  Patient would like to be discharged home. She lives 15 minutes from the hospital & is reliable for return. Discussed strict return precautions.   Assessment and Plan   1. Vaginal bleeding in pregnancy, third trimester   2. [redacted] weeks gestation of pregnancy    -Dr. Mindi Slicker will call  patient tomorrow to check on symptoms. -Office will call patient Monday to schedule an appointment -Reviewed reasons to return to MAU including bleeding, pain, DFM  Judeth Horn 01/06/2023, 2:12 PM

## 2023-01-06 NOTE — MAU Note (Signed)
Leslie Guerrero is a 32 y.o. at [redacted]w[redacted]d here in MAU reporting: woke up around 0730, noted blood in pajama pants and in the toilet.  Not a crazy amount, but like if my period had started.  Has continued this morning, has been light on pad.. Called on call doc, was told to come in. No strenuous exam, no intercourse.  Denies any pain.  Onset of complaint: 0730 Pain score: none Vitals:   01/06/23 1352  BP: 125/81  Pulse: (!) 120  Resp: 20  Temp: 98 F (36.7 C)  SpO2: 99%     FHT:152 Lab orders placed from triage:

## 2023-01-07 ENCOUNTER — Inpatient Hospital Stay (HOSPITAL_COMMUNITY)
Admission: AD | Admit: 2023-01-07 | Discharge: 2023-01-09 | DRG: 833 | Disposition: A | Discharge status: Home / Self Care | Payer: BC Managed Care – PPO | Attending: Obstetrics and Gynecology | Admitting: Obstetrics and Gynecology

## 2023-01-07 ENCOUNTER — Encounter (HOSPITAL_COMMUNITY): Payer: Self-pay | Admitting: Obstetrics and Gynecology

## 2023-01-07 DIAGNOSIS — Z91048 Other nonmedicinal substance allergy status: Secondary | ICD-10-CM | POA: Diagnosis not present

## 2023-01-07 DIAGNOSIS — Z833 Family history of diabetes mellitus: Secondary | ICD-10-CM | POA: Diagnosis not present

## 2023-01-07 DIAGNOSIS — Z3A28 28 weeks gestation of pregnancy: Secondary | ICD-10-CM

## 2023-01-07 DIAGNOSIS — Z823 Family history of stroke: Secondary | ICD-10-CM

## 2023-01-07 DIAGNOSIS — O26893 Other specified pregnancy related conditions, third trimester: Secondary | ICD-10-CM | POA: Diagnosis present

## 2023-01-07 DIAGNOSIS — O4452 Low lying placenta with hemorrhage, second trimester: Secondary | ICD-10-CM | POA: Diagnosis present

## 2023-01-07 DIAGNOSIS — Z6791 Unspecified blood type, Rh negative: Secondary | ICD-10-CM

## 2023-01-07 DIAGNOSIS — E669 Obesity, unspecified: Secondary | ICD-10-CM | POA: Diagnosis not present

## 2023-01-07 DIAGNOSIS — Z8249 Family history of ischemic heart disease and other diseases of the circulatory system: Secondary | ICD-10-CM

## 2023-01-07 DIAGNOSIS — O4693 Antepartum hemorrhage, unspecified, third trimester: Secondary | ICD-10-CM | POA: Diagnosis present

## 2023-01-07 DIAGNOSIS — O4453 Low lying placenta with hemorrhage, third trimester: Secondary | ICD-10-CM | POA: Diagnosis present

## 2023-01-07 DIAGNOSIS — Z888 Allergy status to other drugs, medicaments and biological substances status: Secondary | ICD-10-CM | POA: Diagnosis not present

## 2023-01-07 DIAGNOSIS — O99213 Obesity complicating pregnancy, third trimester: Secondary | ICD-10-CM | POA: Diagnosis not present

## 2023-01-07 MED ORDER — CALCIUM CARBONATE ANTACID 500 MG PO CHEW: 2.0000 | CHEWABLE_TABLET | ORAL | Status: DC | PRN

## 2023-01-07 MED ORDER — PRENATAL MULTIVITAMIN CH
1.0000 | ORAL_TABLET | Freq: Every day | ORAL | Status: DC
Start: 2023-01-08 — End: 2023-01-09
  Administered 2023-01-08 – 2023-01-09 (×2): 1 via ORAL
  Filled 2023-01-07 (×2): qty 1

## 2023-01-07 MED ORDER — ACETAMINOPHEN 325 MG PO TABS
650.0000 mg | ORAL_TABLET | ORAL | Status: DC | PRN
  Administered 2023-01-08: 650 mg via ORAL
  Filled 2023-01-07 (×2): qty 2

## 2023-01-07 MED ORDER — ZOLPIDEM TARTRATE 5 MG PO TABS: 5.0000 mg | ORAL_TABLET | Freq: Every evening | ORAL | Status: DC | PRN

## 2023-01-07 MED ORDER — SODIUM CHLORIDE 0.9% FLUSH
10.0000 mL | Freq: Two times a day (BID) | INTRAVENOUS | Status: DC
  Administered 2023-01-08 – 2023-01-09 (×3): 10 mL via INTRAVENOUS

## 2023-01-07 MED ORDER — DOCUSATE SODIUM 100 MG PO CAPS
100.0000 mg | ORAL_CAPSULE | Freq: Every day | ORAL | Status: DC
  Administered 2023-01-08: 100 mg via ORAL
  Filled 2023-01-07: qty 1

## 2023-01-07 NOTE — MAU Note (Signed)
.  Leslie Guerrero is a 32 y.o. at [redacted]w[redacted]d here in MAU reporting: pt has low lying placenta was in MAU yesterday with small amount of bleeding and sent home. About 910pm felt wet and 2 large clots come out in toilet. No pain good fetal movement .light streaking on pad that she wore from home (30 min)noted  LMP:  Onset of complaint: 2110 Pain score: 0 Vitals:   01/07/23 2203  BP: 137/71  Pulse: (!) 126  Resp: 18  Temp: 98.3 F (36.8 C)     FHT:147 Lab orders placed from triage:

## 2023-01-07 NOTE — MAU Provider Note (Incomplete)
History     CSN: [id number]  Arrival date and time: 01/07/23 2142   None     Chief Complaint  Patient presents with  . Vaginal Bleeding   Leslie Guerrero is a 32 y.o. G2 P0010 at [redacted]w[redacted]d who receives care at [facility].  She presents today for vaginal bleeding, reports passing several clots, lemon size or larger along with saturating underwear and wearing a pad here - approximately 15 minutes - moderate amount of bleeding. Denies pain. Reports regular and vigorous fetal movement.   OB History     Gravida  2   Para      Term      Preterm      AB  1   Living         SAB      IAB      Ectopic  1   Multiple      Live Births              Past Medical History:  Diagnosis Date  . Migraines   . POTS (postural orthostatic tachycardia syndrome)   . Thrombocytosis 05/2011   platelet count 531,000    Past Surgical History:  Procedure Laterality Date  . DIAGNOSTIC LAPAROSCOPY WITH REMOVAL OF ECTOPIC PREGNANCY N/A 10/17/2020   Procedure: DIAGNOSTIC LAPAROSCOPY WITH REMOVAL OF ECTOPIC PREGNANCY;  Surgeon: Genia Del, MD;  Location: [facility];  Service: Gynecology;  Laterality: N/A;  . Fatty mass removed    . LAPAROSCOPIC UNILATERAL SALPINGECTOMY Right 10/17/2020   Procedure: LAPAROSCOPIC RIGHT SALPINGECTOMY, EVACUATION OF HEMOPERITONEUM;  Surgeon: Genia Del, MD;  Location: [facility];  Service: Gynecology;  Laterality: Right;  . Nexplanon insertion  04/25/2016    Family History  Problem Relation Age of Onset  . Hypertension Mother   . Diabetes Mother   . Fibromyalgia Mother   . Migraines Mother   . Hypertension Father   . Heart attack Father   . Diabetes Paternal Uncle   . Breast cancer Paternal Grandmother   . Diabetes Paternal Grandfather   . Stroke Paternal Grandfather     Social History   Tobacco Use  . Smoking status: Never  . Smokeless tobacco: Never  Vaping Use  . Vaping status: Never Used  Substance Use Topics  . Alcohol use: Not  Currently    Alcohol/week: 1.0 standard drink of alcohol    Types: 1 Standard drinks or equivalent per week    Comment: socially  . Drug use: No    Allergies:  Allergies  Allergen Reactions  . Fluoxetine Other (See Comments)    "Gave me a headache and I felt badly"  . Lavender Oil Hives  . Shellfish-Derived Products Hives, Itching and Other (See Comments)    Sweating, also (no breathing issues, however)  . Nickel Rash    Medications Prior to Admission  Medication Sig Dispense Refill Last Dose/Taking  . acetaminophen (TYLENOL) 325 MG tablet Take 650 mg by mouth every 6 (six) hours as needed for mild pain (pain score 1-3).     . butalbital-acetaminophen-caffeine (FIORICET) 50-325-40 MG tablet Take 2 tablets by mouth 2 (two) times daily as needed for headache.     . Prenatal Vit-Fe Fumarate-FA (PRENATAL MULTIVITAMIN) TABS tablet Take 1 tablet by mouth daily at 12 noon.       Review of Systems  Constitutional:  Negative for chills, fatigue, fever and unexpected weight change.  Respiratory:  Negative for cough and shortness of breath.   Cardiovascular:  Negative for  chest pain and palpitations.  Gastrointestinal:  Negative for abdominal pain, constipation, diarrhea, nausea and vomiting.  Genitourinary:  Positive for vaginal bleeding. Negative for difficulty urinating, flank pain, frequency and urgency.   Physical Exam   Blood pressure 137/71, pulse (!) 126, temperature 98.3 F (36.8 C), resp. rate 18, last menstrual period 06/23/2022.  Physical Exam Vitals reviewed.  Constitutional:      General: She is not in acute distress.    Appearance: Normal appearance. She is not ill-appearing, toxic-appearing or diaphoretic.  HENT:     Head: Normocephalic.  Cardiovascular:     Rate and Rhythm: Normal rate.     Pulses: Normal pulses.  Pulmonary:     Effort: Pulmonary effort is normal.  Skin:    General: Skin is warm.     Capillary Refill: Capillary refill takes less than 2  seconds.  Neurological:     Mental Status: She is alert and oriented to person, place, and time.  Psychiatric:        Mood and Affect: Mood normal.        Behavior: Behavior normal.        Thought Content: Thought content normal.        Judgment: Judgment normal.    Fetal Assessment 145 bpm, Mod Var, -Decels, +Accels Toco: UI  MAU Course  No results found for this or any previous visit (from the past 24 hours). No results found.  MDM PE EFM Reviewed records   Assessment and Plan  32yo  G2 P0010  SIUP at 28.2 weeks Cat 1 FT Vaginal bleeding, third episode  - Speculum exam revealed a moderate amount of bleeding, no clots. Two large cotton swabs used to visualize cervix. Cervix visually closed.  -Fetal tracing reassuring.  - Exam findings discussed.  - Consulted Dr. Berton Lan, who recommends admission to [facility] given this is her 3rd episode of bleeding. - Consulted Dr. Chestine Spore, patient's on-call OB with recommendation of admission. Dr. Chestine Spore agrees with plan. Care handed over to Dr. Chestine Spore for management. - Patient in stable condition at time of note.    Richardson Landry MSN, CNM 01/07/2023, 11:13 PM

## 2023-01-07 NOTE — MAU Provider Note (Signed)
History     CSN: [id number]  Arrival date and time: 01/07/23 2142   None     Chief Complaint  Patient presents with   Vaginal Bleeding   Leslie Guerrero is a 32 y.o. G2 P0010 at [redacted]w[redacted]d who receives care at [facility].  She presents today for vaginal bleeding, reports passing several clots, lemon size or larger along with saturating underwear and wearing a pad here - approximately 15 minutes - moderate amount of bleeding. Denies pain. Reports regular and vigorous fetal movement.   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   platelet count 531,000    Past Surgical History:  Procedure Laterality Date   DIAGNOSTIC LAPAROSCOPY WITH REMOVAL OF ECTOPIC PREGNANCY N/A 10/17/2020   Procedure: DIAGNOSTIC LAPAROSCOPY WITH REMOVAL OF ECTOPIC PREGNANCY;  Surgeon: Genia Del, MD;  Location: [facility] OR;  Service: Gynecology;  Laterality: N/A;   Fatty mass removed     LAPAROSCOPIC UNILATERAL SALPINGECTOMY Right 10/17/2020   Procedure: LAPAROSCOPIC RIGHT SALPINGECTOMY, EVACUATION OF HEMOPERITONEUM;  Surgeon: Genia Del, MD;  Location: [facility] OR;  Service: Gynecology;  Laterality: Right;   Nexplanon insertion  04/25/2016    Family History  Problem Relation Age of Onset   Hypertension Mother    Diabetes Mother    Fibromyalgia Mother    Migraines Mother    Hypertension Father    Heart attack Father    Diabetes Paternal Uncle    Breast cancer Paternal Grandmother    Diabetes Paternal Grandfather    Stroke Paternal Grandfather     Social History   Tobacco Use   Smoking status: Never   Smokeless tobacco: Never  Vaping Use   Vaping status: Never Used  Substance Use Topics   Alcohol use: Not Currently     Alcohol/week: 1.0 standard drink of alcohol    Types: 1 Standard drinks or equivalent per week    Comment: socially   Drug use: No    Allergies:  Allergies  Allergen Reactions   Fluoxetine Other (See Comments)    "Gave me a headache and I felt badly"   Lavender Oil Hives   Shellfish-Derived Products Hives, Itching and Other (See Comments)    Sweating, also (no breathing issues, however)   Nickel Rash    Medications Prior to Admission  Medication Sig Dispense Refill Last Dose/Taking   acetaminophen (TYLENOL) 325 MG tablet Take 650 mg by mouth every 6 (six) hours as needed for mild pain (pain score 1-3).      butalbital-acetaminophen-caffeine (FIORICET) 50-325-40 MG tablet Take 2 tablets by mouth 2 (two) times daily as needed for headache.      Prenatal Vit-Fe Fumarate-FA (PRENATAL MULTIVITAMIN) TABS tablet Take 1 tablet by mouth daily at 12 noon.       Review of Systems  Constitutional:  Negative for chills, fatigue, fever and unexpected weight change.  Respiratory:  Negative for cough and shortness of breath.   Cardiovascular:  Negative for  chest pain and palpitations.  Gastrointestinal:  Negative for abdominal pain, constipation, diarrhea, nausea and vomiting.  Genitourinary:  Positive for vaginal bleeding. Negative for difficulty urinating, flank pain, frequency and urgency.   Physical Exam   Blood pressure 135/84, pulse (!) 105, temperature 98.4 F (36.9 C), temperature source Oral, resp. rate 18, height 5\' 2"  (1.575 m), weight 103 kg, last menstrual period 06/23/2022, SpO2 98%.  Physical Exam Vitals reviewed. Exam conducted with a chaperone present.  Constitutional:      General: She is not in acute distress.    Appearance: Normal appearance. She is not ill-appearing, toxic-appearing or diaphoretic.  HENT:     Head: Normocephalic.  Cardiovascular:     Rate and Rhythm: Normal rate.     Pulses: Normal pulses.  Pulmonary:     Effort: Pulmonary effort is normal.   Genitourinary:    Vagina: Normal.     Cervix: Cervical bleeding present.     Comments: Speculum inserted to observe moderate amount of bright red bleeding. Two large cotton swabs used to remove blood to observe cervix. Visually closed. No blood clots.  Skin:    General: Skin is warm.     Capillary Refill: Capillary refill takes less than 2 seconds.  Neurological:     Mental Status: She is alert and oriented to person, place, and time.  Psychiatric:        Mood and Affect: Mood normal.        Behavior: Behavior normal.        Thought Content: Thought content normal.        Judgment: Judgment normal.    Fetal Assessment 145 bpm, Mod Var, -Decels, +Accels Toco: UI  MAU Course   Results for orders placed or performed during the hospital encounter of 01/07/23 (from the past 24 hours)  Type and screen [facility]     Status: None   Collection Time: 01/08/23 12:08 AM  Result Value Ref Range   ABO/RH(D) AB NEG    Antibody Screen POS    Sample Expiration 01/11/2023,2359    Antibody Identification      PASSIVELY ACQUIRED ANTI-D Performed at [facility], [address] [place], [place] [address]   CBC     Status: Abnormal   Collection Time: 01/08/23 12:10 AM  Result Value Ref Range   WBC 16.3 (H) 4.0 - 10.5 K/uL   RBC 4.16 3.87 - 5.11 MIL/uL   Hemoglobin 12.6 12.0 - 15.0 g/dL   HCT 19.1 47.8 - 29.5 %   MCV 90.4 80.0 - 100.0 fL   MCH 30.3 26.0 - 34.0 pg   MCHC 33.5 30.0 - 36.0 g/dL   RDW 62.1 30.8 - 65.7 %   Platelets 398 150 - 400 K/uL   nRBC 0.0 0.0 - 0.2 %   No results found.  MDM PE EFM Reviewed records   Assessment and Plan  32yo  G2 P0010  SIUP at 28.2 weeks Cat 1 FT Vaginal bleeding, third episode  - Speculum exam revealed a moderate amount of bleeding, no clots. Two large cotton swabs used to visualize cervix. Cervix visually closed.  -Fetal tracing reassuring.  - Exam findings discussed.  - Consulted Dr. Berton Lan, who recommends  [facility] given this is her 3rd episode of bleeding. - Consulted Dr. Chestine Spore, patient's on-call OB with recommendation of admission. Dr. Chestine Spore agrees with plan. Care handed over to Dr. Chestine Spore for management. - Patient in stable condition at time of handoff.  Richardson Landry MSN, CNM 01/08/2023, 2:02 AM

## 2023-01-08 ENCOUNTER — Other Ambulatory Visit: Payer: Self-pay

## 2023-01-08 ENCOUNTER — Inpatient Hospital Stay (HOSPITAL_COMMUNITY): Payer: BC Managed Care – PPO

## 2023-01-08 DIAGNOSIS — O99213 Obesity complicating pregnancy, third trimester: Secondary | ICD-10-CM

## 2023-01-08 DIAGNOSIS — E669 Obesity, unspecified: Secondary | ICD-10-CM | POA: Diagnosis not present

## 2023-01-08 DIAGNOSIS — O4452 Low lying placenta with hemorrhage, second trimester: Secondary | ICD-10-CM | POA: Diagnosis not present

## 2023-01-08 DIAGNOSIS — Z3A28 28 weeks gestation of pregnancy: Secondary | ICD-10-CM | POA: Diagnosis not present

## 2023-01-08 DIAGNOSIS — O4453 Low lying placenta with hemorrhage, third trimester: Principal | ICD-10-CM

## 2023-01-08 LAB — CBC WITH DIFFERENTIAL/PLATELET
Abs Immature Granulocytes: 0.09 10*3/uL — ABNORMAL HIGH (ref 0.00–0.07)
Basophils Absolute: 0 10*3/uL (ref 0.0–0.1)
Basophils Relative: 0 %
Eosinophils Absolute: 0.1 10*3/uL (ref 0.0–0.5)
Eosinophils Relative: 1 %
HCT: 34.2 % — ABNORMAL LOW (ref 36.0–46.0)
Hemoglobin: 11.7 g/dL — ABNORMAL LOW (ref 12.0–15.0)
Immature Granulocytes: 1 %
Lymphocytes Relative: 10 %
Lymphs Abs: 1.4 10*3/uL (ref 0.7–4.0)
MCH: 30.8 pg (ref 26.0–34.0)
MCHC: 34.2 g/dL (ref 30.0–36.0)
MCV: 90 fL (ref 80.0–100.0)
Monocytes Absolute: 0.8 10*3/uL (ref 0.1–1.0)
Monocytes Relative: 6 %
Neutro Abs: 11.5 10*3/uL — ABNORMAL HIGH (ref 1.7–7.7)
Neutrophils Relative %: 82 %
Platelets: 370 10*3/uL (ref 150–400)
RBC: 3.8 MIL/uL — ABNORMAL LOW (ref 3.87–5.11)
RDW: 14.1 % (ref 11.5–15.5)
WBC: 13.9 10*3/uL — ABNORMAL HIGH (ref 4.0–10.5)
nRBC: 0 % (ref 0.0–0.2)

## 2023-01-08 LAB — TYPE AND SCREEN
ABO/RH(D): AB NEG
Antibody Screen: POSITIVE

## 2023-01-08 LAB — PROTIME-INR
INR: 1 (ref 0.8–1.2)
Prothrombin Time: 12.9 s (ref 11.4–15.2)

## 2023-01-08 LAB — CBC
HCT: 37.6 % (ref 36.0–46.0)
Hemoglobin: 12.6 g/dL (ref 12.0–15.0)
MCH: 30.3 pg (ref 26.0–34.0)
MCHC: 33.5 g/dL (ref 30.0–36.0)
MCV: 90.4 fL (ref 80.0–100.0)
Platelets: 398 10*3/uL (ref 150–400)
RBC: 4.16 MIL/uL (ref 3.87–5.11)
RDW: 13.9 % (ref 11.5–15.5)
WBC: 16.3 10*3/uL — ABNORMAL HIGH (ref 4.0–10.5)
nRBC: 0 % (ref 0.0–0.2)

## 2023-01-08 LAB — KLEIHAUER-BETKE STAIN
# Vials RhIg: 1
Fetal Cells %: 0 %
Quantitation Fetal Hemoglobin: 0 mL

## 2023-01-08 LAB — APTT: aPTT: 25 s (ref 24–36)

## 2023-01-08 MED ORDER — ACETAMINOPHEN-CAFFEINE 500-65 MG PO TABS
2.0000 | ORAL_TABLET | Freq: Four times a day (QID) | ORAL | Status: DC | PRN
  Filled 2023-01-08: qty 2

## 2023-01-08 NOTE — Consult Note (Signed)
Neonatology Consult Note:  At the request of the patients obstetrician Dr. Chestine Spore I met with Leslie Guerrero and FOB.  She is a 32 y.o. G2P0010 at [redacted]w[redacted]d admitted for vaginal bleeding in the setting of a low-lying placenta.  This is her second major clinical vaginal bleeding episode this pregnancy.  Her previous episode was on 12/3 and she received betamethasone at that time.    Pregnancy complicated by Rh negative.  Received rhogam during 26 week admission.  We discussed morbidity/mortality at this gestional age, delivery room resuscitation, including intubation and surfactant in DR.  Discussed mechanical ventilation and risk for chronic lung disease, risk for IVH with potential for motor / cognitive deficits, ROP, NEC, sepsis, as well as temperature instability and feeding immaturity.  Discussed NG / OG feeds, benefits of MBM in reducing incidence of NEC.   Discussed likely length of stay.  Thank you for allowing [place] to participate in her care.  Please call with questions.  John Giovanni, DO  Neonatologist  The total length of face-to-face or floor / unit time for this encounter was 35 minutes.  Counseling and / or coordination of care was greater than fifty percent of the time.

## 2023-01-08 NOTE — H&P (Signed)
32 y.o. Guerrero @ [redacted]w[redacted]d presents who presents with vaginal bleeding.  She has a history of a low lying placenta and was admitted at 26 weeks for her first episode of vaginal bleeding.  She received betamethasone during that admission on 12/3 and 12/4.  Placenta was noted to be low lying 1.9 cm from os.  Of note, she followed up in the office on 12/12 and US showed the leading edge of the placenta to be 0.8 cm from the os.  I do not have access to these images. She presented to MAU yesterday on 12/15 after noticing dark red blood in toilet with clot.  Admission was recommended, but she declined.  Throughout the day, she did not notice any other clots, just dark blood with wiping.    Last night, she felt a gush and noticed bright red bleeding.  She passed two large clots in the toilet and presented to MAU.  Since that time, she has had only spots of blood on the pad and one additional dime sized clot.  She denies cramping or contractions and endorses good fetal movement  Pregnancy complicated by:  Rh negative.  Received rhogam during 26 week admission   Past Medical History:  Diagnosis Date   Migraines    POTS (postural orthostatic tachycardia syndrome)    Thrombocytosis 05/2011   platelet count 531,000    Past Surgical History:  Procedure Laterality Date   DIAGNOSTIC LAPAROSCOPY WITH REMOVAL OF ECTOPIC PREGNANCY N/A 10/17/2020   Procedure: DIAGNOSTIC LAPAROSCOPY WITH REMOVAL OF ECTOPIC PREGNANCY;  Surgeon: Genia Del, MD;  Location: [facility] OR;  Service: Gynecology;  Laterality: N/A;   Fatty mass removed     LAPAROSCOPIC UNILATERAL SALPINGECTOMY Right 10/17/2020   Procedure: LAPAROSCOPIC RIGHT SALPINGECTOMY, EVACUATION OF HEMOPERITONEUM;  Surgeon: Genia Del, MD;  Location: [facility] OR;  Service: Gynecology;  Laterality: Right;   Nexplanon insertion  04/25/2016    OB History  Gravida Para Term Preterm AB Living  2    1   SAB IAB Ectopic Multiple Live Births    1      # Outcome Date GA  Lbr Len/2nd Weight Sex Type Anes PTL Lv  2 Current           1 Ectopic             Social History   Socioeconomic History   Marital status: Married    Spouse name: Not on file   Number of children: Not on file   Years of education: Not on file   Highest education level: Not on file  Occupational History   Not on file  Tobacco Use   Smoking status: Never   Smokeless tobacco: Never  Vaping Use   Vaping status: Never Used  Substance and Sexual Activity   Alcohol use: Not Currently    Alcohol/week: 1.0 standard drink of alcohol    Types: 1 Standard drinks or equivalent per week    Comment: socially   Drug use: No   Sexual activity: Not Currently    Birth control/protection: None    Comment: INTERCOUSRE AGE 66, ,MORE THAN 5 SEXUAL PAJRTNERS  Other Topics Concern   Not on file  Social History Narrative   Not on file   Social Drivers of Health   Financial Resource Strain: Not on file  Food Insecurity: No Food Insecurity (12/26/2022)   Hunger Vital Sign    Worried About Running Out of Food in the Last Year: Never true    Ran Out  of Food in the Last Year: Never true  Transportation Needs: No Transportation Needs (12/26/2022)   PRAPARE - Administrator, Civil Service (Medical): No    Lack of Transportation (Non-Medical): No  Physical Activity: Not on file  Stress: Not on file  Social Connections: Unknown (05/31/2021)   Received from [facility]   Social Network    Social Network: Not on file  Intimate Partner Violence: Not At Risk (12/26/2022)   Humiliation, Afraid, Rape, and Kick questionnaire    Fear of Current or Ex-Partner: No    Emotionally Abused: No    Physically Abused: No    Sexually Abused: No   Fluoxetine, Lavender oil, Shellfish-derived products, and Nickel    Prenatal Transfer Tool  Maternal Diabetes: No Genetic Screening: Normal Maternal Ultrasounds/Referrals: Normal Fetal Ultrasounds or other Referrals:  None Maternal  Substance Abuse:  No Significant Maternal Medications:  None Significant Maternal Lab Results: Rh negative  ABO, Rh: --/--/AB NEG (12/16 0008) Antibody: POS (12/16 0008) Rubella:  Immune RPR:   NR HBsAg:   Negative HIV:   Negative GBS:   unknown     Vitals:   01/07/23 2203 01/08/23 0030  BP: 137/71 135/84  Pulse: (!) 126 (!) 105  Resp: 18 18  Temp: 98.3 F (36.8 C) 98.4 F (36.9 C)  SpO2:  98%     General:  NAD Abdomen:  soft, gravid, non-tender Ex:  no edema SSE:  Per MAU CNM:  moderate bright red blood in vault.  Cervix visually closed.  No clots.  CNM verbally reported no active bleeding from os noted.  Currently, scant old spotting on pad FHTs:  140s, moderate variability, + accelerations, no decelerations Toco:  quiet   A/P   Leslie Guerrero [redacted]w[redacted]d presents with vaginal bleeding in the third trimester --Admit to AP with continuous fetal monitoring and pad counts --Vaginal bleeding.  Second episode of bleeding in pregnancy.  Known low-lying placenta.  Initially 1.9 cm from internal os.  [place] in office on 12/12 suggested leading edge was 0.8 cm from os. I do not have access to these images for review. If [place] in MAU on 12/14 was limited and inconclusive.  Will obtain growth [place] + transvaginal US to evaluated placenta today.  Will request formal MFM consultation --Prematurity: s/p BMZ on 12/3 and 12/4.  Consider NICU consultation  [facility] GEFFEL [place]

## 2023-01-08 NOTE — Consult Note (Addendum)
MFM Inpatient Consult Note -virtual visit note Patient Name: CARLEIGH BERNARDS  Patient MRN:   [id number]  Referring provider: Dr. Timothy Lasso Reason for Consult: Vaginal bleeding in the third trimester, low-lying placenta  HPI: Ellawyn MARIE DIERKING is a 32 y.o. G2P0010 at [redacted]w[redacted]d admitted for vaginal bleeding in the setting of a low-lying placenta.  This is her second major clinical vaginal bleeding episode this pregnancy.  Her previous episode was on 12/3 where she received betamethasone.  The placenta was noted to be 1.9 cm from the internal os.  She Leander Rams presented to the MAU on 12/15 for new onset bleeding.  An ultrasound was performed including transvaginal cervical length assessment and the placental edge is noted to be approximately 1.8 cm from the internal os.  There is evidence of blood clot formation at the level of the cervix.  The patient denies any other clinical symptoms such as contractions, cramping or decreased fetal movement.  We discussed the diagnosis, management and prognosis of a placental abruption in the setting of a low-lying placenta.  I discussed the need for close monitoring in the inpatient setting.  Her vital signs are stable and her hemoglobin is stable at 12.6. Since she is no longer bleeding actively I discussed that we can hold giving her betamethasone and magnesium but that these would both be indicated if there is concern for delivery.  Since she has not been bleeding significantly since admission we can move to 3 times daily nonstress test.  She knows to notify her nurse with concern for decreased fetal movement, vaginal bleeding or cramping.  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  Her most recent platelet count was 398,000.  This is down from her baseline of 400-500,000.  She reports that she was told she did not need further clinical workup.  As far as I can tell it appears to be present since at least 2013.  Patient also has a history of  migraines that are suboptimally controlled because she discontinued her migraine medicine since it is not well studied in pregnancy.  Sonographic findings  Single intrauterine pregnancy at 28w 3d  Fetal cardiac activity: Observed and appears normal.  Presentation: Cephalic.  Limited fetal anatomy appears normal.  Amniotic fluid volume: Within normal limits.  MVP: 6.8 cm.  Placenta: Anterior, low-lying, 1.6 cm from int os. There is  mixed echgenicity in the cervix suggestive of organized blood  clot. This makes it difficult to appreciate the leading edge of  the placenta but it appears to be at least 1-1.5 cm away from  the internal os.  Review of Systems: A review of systems was performed and was negative except per HPI   Past Obstetrical History:  OB History  Gravida Para Term Preterm AB Living  2    1   SAB IAB Ectopic Multiple Live Births    1      # Outcome Date GA Lbr Len/2nd Weight Sex Type Anes PTL Lv  2 Current           1 Ectopic              Past Medical History:  Past Medical History:  Diagnosis Date   Migraines    POTS (postural orthostatic tachycardia syndrome)    Thrombocytosis 05/2011   platelet count 531,000      Past Surgical History:    Past Surgical History:  Procedure Laterality Date   DIAGNOSTIC LAPAROSCOPY WITH REMOVAL OF ECTOPIC PREGNANCY N/A  10/17/2020   Procedure: DIAGNOSTIC LAPAROSCOPY WITH REMOVAL OF ECTOPIC PREGNANCY;  Surgeon: Genia Del, MD;  Location: [facility] OR;  Service: Gynecology;  Laterality: N/A;   Fatty mass removed     LAPAROSCOPIC UNILATERAL SALPINGECTOMY Right 10/17/2020   Procedure: LAPAROSCOPIC RIGHT SALPINGECTOMY, EVACUATION OF HEMOPERITONEUM;  Surgeon: Genia Del, MD;  Location: [facility] OR;  Service: Gynecology;  Laterality: Right;   Nexplanon insertion  04/25/2016     Family History:   family history includes Breast cancer in her paternal grandmother; Diabetes in her mother, paternal grandfather, and paternal uncle;  Fibromyalgia in her mother; Heart attack in her father; Hypertension in her father and mother; Migraines in her mother; Stroke in her paternal grandfather.   Social History:   Social History   Socioeconomic History   Marital status: Married    Spouse name: Not on file   Number of children: Not on file   Years of education: Not on file   Highest education level: Not on file  Occupational History   Not on file  Tobacco Use   Smoking status: Never   Smokeless tobacco: Never  Vaping Use   Vaping status: Never Used  Substance and Sexual Activity   Alcohol use: Not Currently    Alcohol/week: 1.0 standard drink of alcohol    Types: 1 Standard drinks or equivalent per week    Comment: socially   Drug use: No   Sexual activity: Not Currently    Birth control/protection: None    Comment: INTERCOUSRE AGE 62, ,MORE THAN 5 SEXUAL PAJRTNERS  Other Topics Concern   Not on file  Social History Narrative   Not on file   Social Drivers of Health   Financial Resource Strain: Not on file  Food Insecurity: No Food Insecurity (01/08/2023)   Hunger Vital Sign    Worried About Running Out of Food in the Last Year: Never true    Ran Out of Food in the Last Year: Never true  Transportation Needs: No Transportation Needs (01/08/2023)   PRAPARE - Administrator, Civil Service (Medical): No    Lack of Transportation (Non-Medical): No  Physical Activity: Not on file  Stress: Not on file  Social Connections: Unknown (05/31/2021)   Received from [facility], [facility]   Social Network    Social Network: Not on file  Intimate Partner Violence: Not At Risk (01/08/2023)   Humiliation, Afraid, Rape, and Kick questionnaire    Fear of Current or Ex-Partner: No    Emotionally Abused: No    Physically Abused: No    Sexually Abused: No      Home Medications:   No current facility-administered medications on file prior to encounter.   Current Outpatient Medications on File Prior to  Encounter  Medication Sig Dispense Refill   acetaminophen (TYLENOL) 325 MG tablet Take 650 mg by mouth every 6 (six) hours as needed for mild pain (pain score 1-3).     butalbital-acetaminophen-caffeine (FIORICET) 50-325-40 MG tablet Take 2 tablets by mouth 2 (two) times daily as needed for headache.     Prenatal Vit-Fe Fumarate-FA (PRENATAL MULTIVITAMIN) TABS tablet Take 1 tablet by mouth daily at 12 noon.        Allergies:   Allergies  Allergen Reactions   Fluoxetine Other (See Comments)    "Gave me a headache and I felt badly"   Lavender Oil Hives   Shellfish-Derived Products Hives, Itching and Other (See Comments)    Sweating, also (no breathing issues, however)  Nickel Rash     Physical Exam:   Vitals:   01/08/23 0030 01/08/23 0853  BP: 135/84 120/80  Pulse: (!) 105 (!) 102  Resp: 18 18  Temp: 98.4 F (36.9 C) 98.5 F (36.9 C)  SpO2: 98% 100%  No physical exam performed, virtual visit  Assessment  Courtlynn MARIE WADE is a 32 y.o. G2P0010 at [redacted]w[redacted]d admitted for the following. MFM consult was placed due to vaginal bleeding in the setting of a low-lying placenta  Vaginal bleeding in pregnancy, third trimester - Plan: [place] MFM OB Transvaginal, [place] MFM OB Transvaginal, CANCELED: [place] MFM OB DETAIL +14 WK, CANCELED: [place] MFM OB DETAIL +14 WK  Low lying placenta with hemorrhage, second trimester - Plan: [place] MFM OB Transvaginal, [place] MFM OB Transvaginal, CANCELED: [place] MFM OB DETAIL +14 WK, CANCELED: [place] MFM OB DETAIL +14 WK  Recommendations -Okay to move to 3 times daily NST tracings for 1 hour long each.  If she continues to have concern for bleeding, decreased fetal movement or contractions she should be placed on continuous monitoring. -Keep type and screen up-to-date -Kleihauer-Betke test to determine the number of RhoGAM vials -If there is clinically significant bleeding then repeat CBC with coags as needed -Consider a type and cross if there is concern for active  bleeding -Betamethasone for fetal lung maturity and magnesium sulfate infusion for fetal neuroprotection is not indicated at this time due to the patient's clinical stability per the [facility] provider.  Recommend giving both of these if there is concern for the need for delivery based on maternal or fetal status. -Continue inpatient monitoring for at least 24 to 48 hours after the bleeding event.  If there is no new bleeding then discharge can be considered after this timeframe. -Route of delivery: Vaginal delivery is preferred as long as maternal bleeding is minimal to moderate and she is clinically stable and the fetus is tolerating labor.  Cesarean delivery is reserved for cases where bleeding is significant to require blood transfusion, hemodynamic instability or nonreassuring fetal status not responsive to intrauterine resuscitation. -If the patient requires a prolonged hospital stay over 7 days a repeat ultrasound can be considered to assess fetal position and amniotic fluid level -Disposition: continue inpatient care  Thank you for the opportunity to be involved with this patient's care. Please let us know if we can be of any further assistance.   65 minutes of time was spent reviewing the patient's chart including labs, imaging and documentation as well as discussing the diagnosis, management and prognosis of her care.    Braxton Feathers, DO MFM, [facility]   01/08/2023  12:11 PM

## 2023-01-09 NOTE — Discharge Summary (Signed)
Postpartum Discharge Summary       Patient Name: Leslie Guerrero DOB: 03/16/1990 MRN: [id number]  Date of admission: 01/07/2023 Delivery date:This patient has no babies on file. Delivering provider: This patient has no babies on file. Date of discharge: 01/09/2023  Admitting diagnosis: Vaginal bleeding in pregnancy, third trimester [O46.93] Intrauterine pregnancy: [redacted]w[redacted]d     Secondary diagnosis:  Principal Problem:   Vaginal bleeding in pregnancy, third trimester Active Problems:   Low lying placenta with hemorrhage, second trimester  Additional Problems: Rh negative    Discharge diagnosis:  Vaginal bleeding in third trimester                                                 Hospital course: Patient presented late evening on 12/15 with increased vaginal bleeding.  She passed several clots at home and saturated her underwear.  Patient was admitted on 12/16.  At this point bleeding had stabilized.  She remained stable throughout her hospital course with no additional vaginal bleeding.  Antenatal testing was category 1.  Patient received antenatal steroids on 12/3 and 12/4.  She had consultations with the NICU and with maternal-fetal medicine.  Patient received RhoGAM on 12/3.  A Kleihauer-Betke was performed and demonstrated no additional RhoGAM was required.  Patient remained in house for the recommended duration by maternal-fetal medicine and remained stable.  Ultrasound performed on admission showed low-lying placenta with leading edge 1.8 cm from the internal os with evidence of blood clot at the level of the cervix.  Follow-up ultrasound showed the leading edge approximately 1.6 cm from the internal os, however presence of blood clot made it somewhat difficult to interpret true distance.  On 12/17 she is discharged home in stable condition with the planned for home bedrest for approximately 1 week and then reassess.   BMZ received: Yes 12/3 & 12/4 Rhophylac:Yes  12/3 Immunizations administered: Immunization History  Administered Date(s) Administered   HPV Quadrivalent 03/29/2011, 06/13/2011   Tdap 09/16/2017    Physical exam  Vitals:   01/08/23 1247 01/08/23 1625 01/08/23 1945 01/09/23 0823  BP: 132/71 112/73 125/70 115/66  Pulse: (!) 107 (!) 109 (!) 110 (!) 115  Resp: 18 19 18 17   Temp: 98.3 F (36.8 C) 98.5 F (36.9 C) 98.2 F (36.8 C) 97.9 F (36.6 C)  TempSrc: Oral Oral Oral Oral  SpO2: 97% 100% 100% 99%  Weight:      Height:       General: alert, cooperative, and no distress Abdomen: Gravid, soft Fetal heart rate 130s, reactive, category 1 tracing Cervix: Deferred Toco: Quiet  Labs: Lab Results  Component Value Date   WBC 13.9 (H) 01/08/2023   HGB 11.7 (L) 01/08/2023   HCT 34.2 (L) 01/08/2023   MCV 90.0 01/08/2023   PLT 370 01/08/2023      Latest Ref Rng & Units 12/26/2022    6:07 PM  CMP  Glucose 70 - 99 mg/dL 562   BUN 6 - 20 mg/dL 9   Creatinine 1.30 - 8.65 mg/dL 7.84   Sodium 696 - 295 mmol/L 135   Potassium 3.5 - 5.1 mmol/L 3.6   Chloride 98 - 111 mmol/L 103   CO2 22 - 32 mmol/L 21   Calcium 8.9 - 10.3 mg/dL 8.6   Total Protein 6.5 - 8.1 g/dL 6.7   Total  Bilirubin <1.2 mg/dL 0.4   Alkaline Phos 38 - 126 U/L 92   AST 15 - 41 U/L 20   ALT 0 - 44 U/L 17    Edinburgh Score:     No data to display            After visit meds:  Allergies as of 01/09/2023       Reactions   Fluoxetine Other (See Comments)   "Gave me a headache and I felt badly"   Lavender Oil Hives   Shellfish-derived Products Hives, Itching, Other (See Comments)   Sweating, also (no breathing issues, however)   Nickel Rash        Medication List     TAKE these medications    acetaminophen 325 MG tablet Commonly known as: TYLENOL Take 650 mg by mouth every 6 (six) hours as needed for mild pain (pain score 1-3).   butalbital-acetaminophen-caffeine 50-325-40 MG tablet Commonly known as: FIORICET Take 2 tablets by  mouth 2 (two) times daily as needed for headache.   prenatal multivitamin Tabs tablet Take 1 tablet by mouth daily at 12 noon.         Discharge home in stable condition Future Appointments:No future appointments. Follow up Visit:  Follow-up Information     [facility] Follow [facility].   Why: 2-3 days Contact information: [address]  [address] [place] [address] [phone]                     01/09/2023 Waynard Reeds, MD

## 2023-01-09 NOTE — Consult Note (Signed)
Pt discharged

## 2023-01-10 ENCOUNTER — Inpatient Hospital Stay (HOSPITAL_COMMUNITY)
Admission: AD | Admit: 2023-01-10 | Discharge: 2023-01-12 | DRG: 833 | Disposition: A | Discharge status: Home / Self Care | Payer: BC Managed Care – PPO | Attending: Obstetrics and Gynecology | Admitting: Obstetrics and Gynecology

## 2023-01-10 ENCOUNTER — Encounter (HOSPITAL_COMMUNITY): Payer: Self-pay | Admitting: Obstetrics and Gynecology

## 2023-01-10 ENCOUNTER — Other Ambulatory Visit: Payer: Self-pay

## 2023-01-10 ENCOUNTER — Inpatient Hospital Stay (HOSPITAL_COMMUNITY): Payer: BC Managed Care – PPO

## 2023-01-10 DIAGNOSIS — Z6791 Unspecified blood type, Rh negative: Secondary | ICD-10-CM

## 2023-01-10 DIAGNOSIS — Z8249 Family history of ischemic heart disease and other diseases of the circulatory system: Secondary | ICD-10-CM

## 2023-01-10 DIAGNOSIS — O4440 Low lying placenta NOS or without hemorrhage, unspecified trimester: Secondary | ICD-10-CM | POA: Diagnosis present

## 2023-01-10 DIAGNOSIS — O4693 Antepartum hemorrhage, unspecified, third trimester: Secondary | ICD-10-CM | POA: Diagnosis present

## 2023-01-10 DIAGNOSIS — O4453 Low lying placenta with hemorrhage, third trimester: Secondary | ICD-10-CM | POA: Diagnosis present

## 2023-01-10 DIAGNOSIS — O26893 Other specified pregnancy related conditions, third trimester: Secondary | ICD-10-CM | POA: Diagnosis present

## 2023-01-10 DIAGNOSIS — N939 Abnormal uterine and vaginal bleeding, unspecified: Secondary | ICD-10-CM | POA: Diagnosis not present

## 2023-01-10 DIAGNOSIS — Z3A28 28 weeks gestation of pregnancy: Secondary | ICD-10-CM

## 2023-01-10 DIAGNOSIS — Z833 Family history of diabetes mellitus: Secondary | ICD-10-CM

## 2023-01-10 DIAGNOSIS — O4452 Low lying placenta with hemorrhage, second trimester: Secondary | ICD-10-CM

## 2023-01-10 LAB — KLEIHAUER-BETKE STAIN
# Vials RhIg: 1
Fetal Cells %: 0 %
Quantitation Fetal Hemoglobin: 0 mL

## 2023-01-10 LAB — CBC
HCT: 36.2 % (ref 36.0–46.0)
Hemoglobin: 11.9 g/dL — ABNORMAL LOW (ref 12.0–15.0)
MCH: 30.1 pg (ref 26.0–34.0)
MCHC: 32.9 g/dL (ref 30.0–36.0)
MCV: 91.6 fL (ref 80.0–100.0)
Platelets: 373 10*3/uL (ref 150–400)
RBC: 3.95 MIL/uL (ref 3.87–5.11)
RDW: 13.9 % (ref 11.5–15.5)
WBC: 13.7 10*3/uL — ABNORMAL HIGH (ref 4.0–10.5)
nRBC: 0 % (ref 0.0–0.2)

## 2023-01-10 LAB — TYPE AND SCREEN
ABO/RH(D): AB NEG
Antibody Screen: POSITIVE

## 2023-01-10 LAB — APTT: aPTT: 27 s (ref 24–36)

## 2023-01-10 LAB — PROTIME-INR
INR: 1 (ref 0.8–1.2)
Prothrombin Time: 13 s (ref 11.4–15.2)

## 2023-01-10 LAB — FIBRINOGEN: Fibrinogen: 564 mg/dL — ABNORMAL HIGH (ref 210–475)

## 2023-01-10 MED ORDER — LACTATED RINGERS IV SOLN: 125.0000 mL/h | INTRAVENOUS | Status: AC

## 2023-01-10 MED ORDER — LACTATED RINGERS IV BOLUS
500.0000 mL | Freq: Once | INTRAVENOUS | Status: AC
  Administered 2023-01-10: 500 mL via INTRAVENOUS

## 2023-01-10 MED ORDER — DOCUSATE SODIUM 100 MG PO CAPS
100.0000 mg | ORAL_CAPSULE | Freq: Every day | ORAL | Status: DC
  Administered 2023-01-12: 100 mg via ORAL
  Filled 2023-01-10 (×2): qty 1

## 2023-01-10 MED ORDER — ACETAMINOPHEN 325 MG PO TABS
650.0000 mg | ORAL_TABLET | ORAL | Status: DC | PRN
  Administered 2023-01-12: 650 mg via ORAL
  Filled 2023-01-10: qty 2

## 2023-01-10 MED ORDER — CALCIUM CARBONATE ANTACID 500 MG PO CHEW: 2.0000 | CHEWABLE_TABLET | ORAL | Status: DC | PRN

## 2023-01-10 MED ORDER — PRENATAL MULTIVITAMIN CH
1.0000 | ORAL_TABLET | Freq: Every day | ORAL | Status: DC
  Administered 2023-01-12: 1 via ORAL
  Filled 2023-01-10 (×2): qty 1

## 2023-01-10 MED ORDER — HYDROXYZINE HCL 10 MG PO TABS
10.0000 mg | ORAL_TABLET | Freq: Three times a day (TID) | ORAL | Status: DC | PRN
  Administered 2023-01-10: 10 mg via ORAL
  Filled 2023-01-10: qty 1

## 2023-01-10 NOTE — H&P (Signed)
Leslie Guerrero is a 32 y.o. female presenting for evaluation of repeat vaginal bleeding. Woke up with bright red blood saturating her underwear. Got up to go to the bathroom and passed a palm-sized clot and some smaller clots. No active bleeding currently. All fluid appears to be blood, not clear. +FM. No contractions/cramping, lightheaded/dizziness, fever/chills, urinary symptoms.  PNC c/b 1) anterior low lying placenta - Has had two admission for VB in pregnancy (12/3-4 and 12/16)    *completed BMTZ 12/3-4     *recently discharged 12/17 after 24hrs without VB 2) Rh negative - Rhogam at 36wks    *rpt at 38wks 3) POTS  OB History     Gravida  2   Para      Term      Preterm      AB  1   Living         SAB      IAB      Ectopic  1   Multiple      Live Births             Past Medical History:  Diagnosis Date   Migraines    POTS (postural orthostatic tachycardia syndrome)    Thrombocytosis 05/2011   platelet count 531,000   Past Surgical History:  Procedure Laterality Date   DIAGNOSTIC LAPAROSCOPY WITH REMOVAL OF ECTOPIC PREGNANCY N/A 10/17/2020   Procedure: DIAGNOSTIC LAPAROSCOPY WITH REMOVAL OF ECTOPIC PREGNANCY;  Surgeon: Genia Del, MD;  Location: [facility] OR;  Service: Gynecology;  Laterality: N/A;   Fatty mass removed     LAPAROSCOPIC UNILATERAL SALPINGECTOMY Right 10/17/2020   Procedure: LAPAROSCOPIC RIGHT SALPINGECTOMY, EVACUATION OF HEMOPERITONEUM;  Surgeon: Genia Del, MD;  Location: [facility] OR;  Service: Gynecology;  Laterality: Right;   Nexplanon insertion  04/25/2016   Family History: family history includes Breast cancer in her paternal grandmother; Diabetes in her mother, paternal grandfather, and paternal uncle; Fibromyalgia in her mother; Heart attack in her father; Hypertension in her father and mother; Migraines in her mother; Stroke in her paternal grandfather. Social History:  reports that she has never smoked. She has never used  smokeless tobacco. She reports that she does not currently use alcohol after a past usage of about 1.0 standard drink of alcohol per week. She reports that she does not use drugs.     Maternal Diabetes: No1hr 110 Genetic Screening: Normal Maternal Ultrasounds/Referrals: Normal Fetal Ultrasounds or other Referrals:  Referred to Materal Fetal Medicine  Maternal Substance Abuse:  No Significant Maternal Medications:  None Significant Maternal Lab Results:  Rh negative Number of Prenatal Visits:greater than 3 verified prenatal visits Maternal Vaccinations:TDap Other Comments:  None  Review of Systems  Constitutional:  Negative for chills and fever.  Respiratory:  Negative for shortness of breath.   Cardiovascular:  Negative for chest pain, palpitations and leg swelling.  Gastrointestinal:  Negative for abdominal pain and vomiting.  Neurological:  Negative for dizziness, weakness and headaches.  Psychiatric/Behavioral:  Negative for suicidal ideas.    Maternal Medical History:  Reason for admission: Vaginal bleeding.   Contractions: Onset was 1-2 hours ago.   Frequency: rare.   Fetal activity: Perceived fetal activity is normal.   Prenatal complications: Bleeding.   No PIH or IUGR.   Prenatal Complications - Diabetes: none.     Blood pressure 131/75, pulse (!) 127, temperature 98.2 F (36.8 C), temperature source Oral, resp. rate 20, height 5\' 2"  (1.575 m), weight 101.5 kg, last menstrual period 06/23/2022, SpO2  99%. Exam  Constitutional: Well-developed, well-nourished female.  Cardiovascular: tachycardic byu no m/r/g Respiratory: normal effort GI: Abd soft, non-tender MS: Extremities nontender, no edema, normal ROM Neurologic: Alert and oriented x 4    PELVIC EXAM: Cervix pink, visually closed, without lesion. At my time fo exam, no VB in psoterior fornices, scant pink discharge, no extrusion with valsalva, no further clot at os  FHT:  Baseline 150, moderate variability,  10x10 accelerations present, no decelerations Contractions: none   MFM [place] TODAY ----------------------------------------------------------------------  OBSTETRICS REPORT                       (Signed Final 01/10/2023 12:06 pm) ---------------------------------------------------------------------- Patient Info    ID #:       [id number]                          D.O.B.:  1990/09/12 (32 yrs)(F)  Name:       Leslie Guerrero              Visit Date: 01/10/2023 10:12 am ---------------------------------------------------------------------- Performed By    Attending:        Noralee Space MD        Referred By:      [facility]/Triage  Performed By:     Anabel Halon          [facility] ---------------------------------------------------------------------- Orders    #  Description                           Code        Ordered By  1  [place] MFM OB LIMITED                     40981.19    ELIZABETH DAVIS ----------------------------------------------------------------------    #  Order #                     Accession #                Episode #  1  [id number]                   [id number]                 [id number] ---------------------------------------------------------------------- Indications    Vaginal bleeding in pregnancy, third trimester O46.93  [redacted] weeks gestation of pregnancy                Z3A.28 ---------------------------------------------------------------------- Fetal Evaluation    Num Of Fetuses:         1  Fetal Heart Rate(bpm):  145  Cardiac Activity:       Observed  Presentation:           Cephalic  Placenta:               Anterior, low-lying  P. Cord Insertion:      Previously seen    Amniotic Fluid  AFI FV:      Within normal  limits    AFI Sum(cm)     %Tile       Largest Pocket(cm)  14.6            50          5.1    RUQ(cm)       RLQ(cm)       LUQ(cm)        LLQ(cm)  4.5            2.4           5.1            2.6 ---------------------------------------------------------------------- OB History    Gravidity:    2         Term:   0        Prem:   0        SAB:   0  TOP:          0       Ectopic:  1        Living: 0 ---------------------------------------------------------------------- Gestational Age    Clinical EDD:  28w 5d                                        EDD:   03/30/23  Best:          28w 5d     Det. By:  Clinical EDD             EDD:   03/30/23 ---------------------------------------------------------------------- Anatomy    Diaphragm:             Appears normal         Kidneys:                Appear normal  Stomach:               Appears normal, left   Bladder:                Appears normal                         sided ---------------------------------------------------------------------- Impression    Patient is being evaluated at the MAU for c/o vaginal  bleeding. She had several episodes of vaginal bleeding in  this pregnancy.  On transvaginal scan performed 2 days ago, the placental  edge was about 1 to 1.5 cm from the internal os (blood clot  possibly obscuring and preventing correct measurements).  On today's ultrasound, amniotic fluid is normal and good fetal  activity is seen. No retroplacental clot is seen. Transvaginal  ultrasound was not performed as it is unlikely to add  additional information from the finding obtained 2 days ago. ---------------------------------------------------------------------- Recommendations    -Consider inpatient management for now. ----------------------------------------------------------------------                  Noralee Space, MD Electronically Signed Final Report   01/10/2023 12:06 pm  Prenatal labs: ABO, Rh: --/--/AB NEG (12/16 0008) Antibody: POS (12/16 0008) Rubella:  imm RPR:   nr HBsAg:   neg HIV:   nr GBS:   UNK  Assessment/Plan: This is a 32yo G2P0010 @ 28 5/7 by LMP c/w 9wk TVUS  admitted with recurrent VB in setting of known low-lying placenta. Rh neg status, s/p Rhogam at 26wks with first VB admission  MFM scan today as well as  abdominal exam are of low concern for possible abruption/retroplacental clot. Blood clot within cervix on TVUS from 12/16 while in-patient. Suspect, based on patient history and overall benign/reassuring exam, that clot extruded this AM. Closed cervix, vertex IUP.   Given recurrent bleeding event, plan for 48hr admission. Already has outpatient clinic visit on 12/26 -S/p both MFM and NICU consults already -S/p BMTZ 12/3-4 -MFM scan done today, no TVUS repeat given 12/16 exam.  -RH neg: s/p Rhogam at 26wks -CBC, coags, KBT and T&S today -3x/d monitoring, up ad lib, regular diet  If no further significant VB, plan for DC in 48hrs  Leslie Guerrero 01/10/2023, 1:14 PM

## 2023-01-10 NOTE — MAU Note (Signed)
Leslie Guerrero is a 32 y.o. at [redacted]w[redacted]d here in MAU reporting: hx of bleeding with low lying placenta. Was just dc'd yesterday, bleeding had stopped.  Passed a palm sized clot this morning, small amt otherwise. No pain. No leaking. Baby is moving.   Onset of complaint: this morning Vitals:   01/10/23 0825  BP: 132/76  Pulse: (!) 122  Resp: 20  Temp: 98.2 F (36.8 C)  SpO2: 99%     FHT:148 Lab orders placed from triage:

## 2023-01-10 NOTE — Plan of Care (Signed)
  Problem: Education: Goal: Knowledge of General Education information will improve Description: Including pain rating scale, medication(s)/side effects and non-pharmacologic comfort measures Outcome: Completed/Met

## 2023-01-10 NOTE — MAU Provider Note (Signed)
MAU Provider Note  Chief Complaint: Vaginal Bleeding   Event Date/Time   First Provider Initiated Contact with Patient 01/10/23 0853      SUBJECTIVE HPI: Leslie Guerrero is a 32 y.o. G2P0010 at [redacted]w[redacted]d by LMP who presents to maternity admissions reporting vaginal bleeding. Pregnancy c/b known low-lying placenta. Receives [facility] with [facility].  Patient with multiple visits/admissions for same. Notably discharged yesterday. Woke up with bright red blood saturating her underwear. Got up to go to the bathroom and passed a palm-sized clot and some smaller clots. No active bleeding currently. All fluid appears to be blood, not clear. +FM. No contractions/cramping, lightheaded/dizziness, fever/chills, urinary symptoms.   HPI  Past Medical History:  Diagnosis Date   Migraines    POTS (postural orthostatic tachycardia syndrome)    Thrombocytosis 05/2011   platelet count 531,000   Past Surgical History:  Procedure Laterality Date   DIAGNOSTIC LAPAROSCOPY WITH REMOVAL OF ECTOPIC PREGNANCY N/A 10/17/2020   Procedure: DIAGNOSTIC LAPAROSCOPY WITH REMOVAL OF ECTOPIC PREGNANCY;  Surgeon: Genia Del, MD;  Location: [facility] OR;  Service: Gynecology;  Laterality: N/A;   Fatty mass removed     LAPAROSCOPIC UNILATERAL SALPINGECTOMY Right 10/17/2020   Procedure: LAPAROSCOPIC RIGHT SALPINGECTOMY, EVACUATION OF HEMOPERITONEUM;  Surgeon: Genia Del, MD;  Location: [facility] OR;  Service: Gynecology;  Laterality: Right;   Nexplanon insertion  04/25/2016   Social History   Socioeconomic History   Marital status: Married    Spouse name: Not on file   Number of children: Not on file   Years of education: Not on file   Highest education level: Not on file  Occupational History   Not on file  Tobacco Use   Smoking status: Never   Smokeless tobacco: Never  Vaping Use   Vaping status: Never Used  Substance and Sexual Activity   Alcohol use: Not Currently    Alcohol/week: 1.0 standard drink of  alcohol    Types: 1 Standard drinks or equivalent per week    Comment: socially   Drug use: No   Sexual activity: Not Currently    Birth control/protection: None    Comment: INTERCOUSRE AGE 8, ,MORE THAN 5 SEXUAL PAJRTNERS  Other Topics Concern   Not on file  Social History Narrative   Not on file   Social Drivers of Health   Financial Resource Strain: Not on file  Food Insecurity: No Food Insecurity (01/08/2023)   Hunger Vital Sign    Worried About Running Out of Food in the Last Year: Never true    Ran Out of Food in the Last Year: Never true  Transportation Needs: No Transportation Needs (01/08/2023)   PRAPARE - Administrator, Civil Service (Medical): No    Lack of Transportation (Non-Medical): No  Physical Activity: Not on file  Stress: Not on file  Social Connections: Unknown (05/31/2021)   Received from [facility]   Social Network    Social Network: Not on file  Intimate Partner Violence: Not At Risk (01/08/2023)   Humiliation, Afraid, Rape, and Kick questionnaire    Fear of Current or Ex-Partner: No    Emotionally Abused: No    Physically Abused: No    Sexually Abused: No   No current facility-administered medications on file prior to encounter.   Current Outpatient Medications on File Prior to Encounter  Medication Sig Dispense Refill   acetaminophen (TYLENOL) 325 MG tablet Take 650 mg by mouth every 6 (six) hours as needed for mild pain (  pain score 1-3).     butalbital-acetaminophen-caffeine (FIORICET) 50-325-40 MG tablet Take 2 tablets by mouth 2 (two) times daily as needed for headache.     Prenatal Vit-Fe Fumarate-FA (PRENATAL MULTIVITAMIN) TABS tablet Take 1 tablet by mouth daily at 12 noon.     Allergies  Allergen Reactions   Fluoxetine Other (See Comments)    "Gave me a headache and I felt badly"   Lavender Oil Hives   Shellfish-Derived Products Hives, Itching and Other (See Comments)    Sweating, also (no breathing issues,  however)   Nickel Rash    ROS:  Pertinent positives/negatives listed above.  I have reviewed patient's Past Medical Hx, Surgical Hx, Family Hx, Social Hx, medications and allergies.   Physical Exam  Patient Vitals for the past 24 hrs:  BP Temp Temp src Pulse Resp SpO2 Height Weight  01/10/23 0839 131/75 -- -- (!) 127 -- -- -- --  01/10/23 0825 132/76 98.2 F (36.8 C) Oral (!) 122 20 99 % 5\' 2"  (1.575 m) 101.5 kg   Constitutional: Well-developed, well-nourished female. Tearful Cardiovascular: tachycardic Respiratory: normal effort GI: Abd soft, non-tender MS: Extremities nontender, no edema, normal ROM Neurologic: Alert and oriented x 4   PELVIC EXAM: Cervix pink, visually closed, without lesion. Moderate amount of blood present in vagina. Appears to have a small clot working through os. No active bleeding. Dried blood on external genitalia.  FHT:  Baseline 150, moderate variability, 10x10 accelerations present, no decelerations Contractions: none  LAB RESULTS No results found for this or any previous visit (from the past 24 hours).  --/--/AB NEG (12/16 0008)  IMAGING [place] MFM OB LIMITED Result Date: 01/10/2023 ----------------------------------------------------------------------  OBSTETRICS REPORT                       (Signed Final 01/10/2023 12:06 pm) ---------------------------------------------------------------------- Patient Info  ID #:       [id number]                          D.O.B.:  May 14, 1990 (32 yrs)(F)  Name:       Leslie Guerrero              Visit Date: 01/10/2023 10:12 am ---------------------------------------------------------------------- Performed By  Attending:        Noralee Space MD        Referred By:      [facility] MAU/Triage  Performed By:     Anabel Halon          [facility] ---------------------------------------------------------------------- Orders  #   Description                           Code        Ordered By  1  [place] MFM OB LIMITED                     21308.65    Davanna He ----------------------------------------------------------------------  #  Order #  Accession #                Episode #  1  [id number]                   [id number]                 [id number] ---------------------------------------------------------------------- Indications  Vaginal bleeding in pregnancy, third trimester O46.93  [redacted] weeks gestation of pregnancy                Z3A.28 ---------------------------------------------------------------------- Fetal Evaluation  Num Of Fetuses:         1  Fetal Heart Rate(bpm):  145  Cardiac Activity:       Observed  Presentation:           Cephalic  Placenta:               Anterior, low-lying  P. Cord Insertion:      Previously seen  Amniotic Fluid  AFI FV:      Within normal limits  AFI Sum(cm)     %Tile       Largest Pocket(cm)  14.6            50          5.1  RUQ(cm)       RLQ(cm)       LUQ(cm)        LLQ(cm)  4.5           2.4           5.1            2.6 ---------------------------------------------------------------------- OB History  Gravidity:    2         Term:   0        Prem:   0        SAB:   0  TOP:          0       Ectopic:  1        Living: 0 ---------------------------------------------------------------------- Gestational Age  Clinical EDD:  28w 5d                                        EDD:   03/30/23  Best:          28w 5d     Det. By:  Clinical EDD             EDD:   03/30/23 ---------------------------------------------------------------------- Anatomy  Diaphragm:             Appears normal         Kidneys:                Appear normal  Stomach:               Appears normal, left   Bladder:                Appears normal                         sided ---------------------------------------------------------------------- Impression  Patient is being evaluated at the MAU for c/o vaginal  bleeding. She had several  episodes of vaginal bleeding in  this pregnancy.  On transvaginal scan performed 2 days ago, the placental  edge was about 1 to 1.5 cm from the internal os (blood  clot  possibly obscuring and preventing correct measurements).  On today's ultrasound, amniotic fluid is normal and good fetal  activity is seen. No retroplacental clot is seen. Transvaginal  ultrasound was not performed as it is unlikely to add  additional information from the finding obtained 2 days ago. ---------------------------------------------------------------------- Recommendations  -Consider inpatient management for now. ----------------------------------------------------------------------                  Noralee Space, MD Electronically Signed Final Report   01/10/2023 12:06 pm ----------------------------------------------------------------------   [place] MFM OB DETAIL +14 WK Result Date: 01/08/2023 ----------------------------------------------------------------------  OBSTETRICS REPORT                       (Signed Final 01/08/2023 09:33 am) ---------------------------------------------------------------------- Patient Info  ID #:       [id number]                          D.O.B.:  [birth year] (32 yrs)(F)  Name:       Leslie Jester Qualley              Visit Date: 01/08/2023 07:25 am ---------------------------------------------------------------------- Performed By  Attending:        Braxton Feathers DO       Ref. Address:     [facility]                                                             [address] [place]                                                             [address]  Performed By:     Isac Sarna        [facility]  Referred By:      Sherian Rein MD  ---------------------------------------------------------------------- Orders  #  Description  Code        Ordered By  1  [place] MFM OB DETAIL +14 WK               [id number]    Marlow Baars  2  [place] MFM OB TRANSVAGINAL                16109.6     [facility] ----------------------------------------------------------------------  #  Order #                     Accession #                Episode #  1  [id number]                   [id number]                 [id number]  2  [id number]                   [id number]                 [id number] ---------------------------------------------------------------------- Indications  Low lying placenta, antepartum                 O44.40  Vaginal bleeding in pregnancy, third trimester O46.93  Obesity complicating pregnancy, third          O99.213  trimester (PG BMI 40)  [redacted] weeks gestation of pregnancy                Z3A.28  Encounter for antenatal screening for          Z36.3  malformations  LR female per pt ---------------------------------------------------------------------- Fetal Evaluation  Num Of Fetuses:         1  Fetal Heart Rate(bpm):  147  Cardiac Activity:       Observed  Presentation:           Cephalic  Placenta:               Anterior, low-lying, 1.6 cm from int os  P. Cord Insertion:      Visualized  Amniotic Fluid  AFI FV:      Within normal limits  AFI Sum(cm)     %Tile       Largest Pocket(cm)  21.6            88          6.8  RUQ(cm)       RLQ(cm)       LUQ(cm)        LLQ(cm)  6.3           6.8           4.4            4.1  Comment:    No placental abruption or previa identified. ---------------------------------------------------------------------- Biometry  BPD:      71.7  mm     G. Age:  28w 5d         49  %    CI:        74.04   %    70 - 86  FL/HC:      19.1   %    18.8 - 20.6  HC:      264.6  mm     G. Age:  28w 6d         29  %    HC/AC:      1.11        1.05 - 1.21  AC:      238.4  mm      G. Age:  28w 1d         34  %    FL/BPD:     70.6   %    71 - 87  FL:       50.6  mm     G. Age:  27w 1d          8  %    FL/AC:      21.2   %    20 - 24  HUM:      43.4  mm     G. Age:  25w 6d        < 5  %  CER:      30.9  mm     G. Age:  26w 6d         12  %  LV:        3.9  mm  Est. FW:    1145  gm      2 lb 8 oz     20  % ---------------------------------------------------------------------- OB History  Gravidity:    2         Term:   0        Prem:   0        SAB:   0  TOP:          0       Ectopic:  1        Living: 0 ---------------------------------------------------------------------- Gestational Age  Clinical EDD:  28w 3d                                        EDD:   03/30/23  U/S Today:     28w 2d                                        EDD:   03/31/23  Best:          28w 3d     Det. By:  Clinical EDD             EDD:   03/30/23 ---------------------------------------------------------------------- Targeted Anatomy  Central Nervous System  Calvarium/Cranial V.:  Appears normal         Cereb./Vermis:          Appears normal  Cavum:                 Appears normal         Cisterna Magna:         Appears normal  Lateral Ventricles:    Appears normal         Midline Falx:           Appears normal  Choroid Plexus:        Not well visualized  Spine  Cervical:  Limited                Sacral:                 Not well visualized  Thoracic:              Limited                Shape/Curvature:        Appears normal  Lumbar:                Limited  Head/Neck  Lips:                  Appears normal         Profile:                Appears normal  Neck:                  Appears normal         Orbits/Eyes:            Appears normal  Nuchal Fold:           Not applicable         Mandible:               Not well visualized  Nasal Bone:            Present                Maxilla:                Not well visualized  Thorax  4 Chamber View:        Not well visualized    Interventr. Septum:     Not well visualized  Cardiac  Rhythm:        Normal                 Cardiac Axis:           Normal  Cardiac Situs:         Appears normal         Diaphragm:              Appears normal  Rt Outflow Tract:      Not well visualized    3 Vessel View:          Not well visualized  Lt Outflow Tract:      Appears normal         3 V Trachea View:       Appears normal  Aortic Arch:           Appears normal         IVC:                    Not well visualized  Ductal Arch:           Not well visualized    Crossing:               Not well visualized  SVC:                   Not well visualized  Abdomen  Ventral Wall:          Not well visualized    Lt Kidney:              Appears normal  Cord Insertion:        Not well visualized  Rt Kidney:              Appears normal  Situs:                 Appears normal         Bladder:                Appears normal  Stomach:               Appears normal  Extremities  Lt Humerus:            Not well visualized    Lt Femur:               Appears normal  Rt Humerus:            Appears normal         Rt Femur:               Appears normal  Lt Forearm:            Appears normal         Lt Lower Leg:           Appears normal  Rt Forearm:            Appears normal         Rt Lower Leg:           Appears normal  Lt Hand:               Open hand nml          Lt Foot:                Foot visualized  Rt Hand:               Not well visualized    Rt Foot:                Foot visualized  Other  Umbilical Cord:        Normal 3-vessel        Genitalia:              Female-nml  Comment:     Technically difficult due to maternal habitus and fetal position. ---------------------------------------------------------------------- Cervix Uterus Adnexa  Cervix  Length:            4.5  cm.  Measured transvaginally  Uterus  No abnormality visualized.  Right Ovary  Size(cm)     2.01   x   1.73   x  1.68      Vol(ml): 3.06  Within normal limits.  Left Ovary  Not visualized.  Cul De Sac  No free fluid seen.  Adnexa  No abnormality visualized  ---------------------------------------------------------------------- Comments  Hospital Ultrasound  The patient presented to the MAU for vaginal bleeding in the  setting of a low-lying placenta. She is now admitted vaginal  bleeding.  Sonographic findings  Single intrauterine pregnancy at 28w 3d  Fetal cardiac activity: Observed and appears normal.  Presentation: Cephalic.  Limited fetal anatomy appears normal.  Amniotic fluid volume: Within normal limits.  MVP: 6.8 cm.  Placenta: Anterior, low-lying, 1.6 cm from int os. There is  mixed echgenicity in the cervix suggestive of organized blood  clot. This makes it difficult to appreciate the leading edge of  the placenta but it appears to be at least 1-1.5 cm away from  the internal os.  Recommendations  - See Epic for recomendations  This was a limited ultrasound  with a remote read. If an official  MFM consult is requested for any reason please call/place an  order in Epic. ----------------------------------------------------------------------                   Braxton Feathers, DO Electronically Signed Final Report   01/08/2023 09:33 am ----------------------------------------------------------------------   [place] MFM OB Transvaginal Result Date: 01/08/2023 ----------------------------------------------------------------------  OBSTETRICS REPORT                       (Signed Final 01/08/2023 09:33 am) ---------------------------------------------------------------------- Patient Info  ID #:       [id number]                          D.O.B.:  August 09, 1990 (32 yrs)(F)  Name:       Leslie Jester Mynhier              Visit Date: 01/08/2023 07:25 am ---------------------------------------------------------------------- Performed By  Attending:        Braxton Feathers DO       Ref. Address:     [facility]                                                             [address] [place]                                                             [address]  Performed By:     Isac Sarna        [facility]  Referred By:      Sherian Rein MD ---------------------------------------------------------------------- Orders  #  Description                           Code        Ordered By  1  [place] MFM OB DETAIL +14 WK               76811.01    Marlow Baars  2  [place] MFM OB TRANSVAGINAL                16109.6     [facility] ----------------------------------------------------------------------  #  Order #                     Accession #                Episode #  1  [id number]                   [id number]                 [id number]  2  [id number]                   [id number]                 [id number] ---------------------------------------------------------------------- Indications  Low lying placenta, antepartum                 O44.40  Vaginal bleeding in pregnancy, third trimester O46.93  Obesity complicating pregnancy, third          O99.213  trimester (PG BMI 40)  [redacted] weeks gestation of pregnancy                Z3A.28  Encounter for antenatal screening for          Z36.3  malformations  LR female per pt ---------------------------------------------------------------------- Fetal Evaluation  Num Of Fetuses:         1  Fetal Heart Rate(bpm):  147  Cardiac Activity:       Observed  Presentation:           Cephalic  Placenta:               Anterior, low-lying, 1.6 cm from int os  P. Cord Insertion:      Visualized  Amniotic Fluid  AFI FV:      Within normal limits  AFI Sum(cm)     %Tile       Largest Pocket(cm)  21.6            88          6.8  RUQ(cm)       RLQ(cm)       LUQ(cm)        LLQ(cm)  6.3           6.8           4.4            4.1  Comment:    No placental abruption or previa identified.  ---------------------------------------------------------------------- Biometry  BPD:      71.7  mm     G. Age:  28w 5d         49  %    CI:        74.04   %    70 - 86                                                          FL/HC:      19.1   %  18.8 - 20.6  HC:      264.6  mm     G. Age:  28w 6d         29  %    HC/AC:      1.11        1.05 - 1.21  AC:      238.4  mm     G. Age:  28w 1d         34  %    FL/BPD:     70.6   %    71 - 87  FL:       50.6  mm     G. Age:  27w 1d          8  %    FL/AC:      21.2   %    20 - 24  HUM:      43.4  mm     G. Age:  25w 6d        < 5  %  CER:      30.9  mm     G. Age:  26w 6d         12  %  LV:        3.9  mm  Est. FW:    1145  gm      2 lb 8 oz     20  % ---------------------------------------------------------------------- OB History  Gravidity:    2         Term:   0        Prem:   0        SAB:   0  TOP:          0       Ectopic:  1        Living: 0 ---------------------------------------------------------------------- Gestational Age  Clinical EDD:  28w 3d                                        EDD:   03/30/23  U/S Today:     28w 2d                                        EDD:   03/31/23  Best:          28w 3d     Det. By:  Clinical EDD             EDD:   03/30/23 ---------------------------------------------------------------------- Targeted Anatomy  Central Nervous System  Calvarium/Cranial V.:  Appears normal         Cereb./Vermis:          Appears normal  Cavum:                 Appears normal         Cisterna Magna:         Appears normal  Lateral Ventricles:    Appears normal         Midline Falx:           Appears normal  Choroid Plexus:        Not well visualized  Spine  Cervical:              Limited  Sacral:                 Not well visualized  Thoracic:              Limited                Shape/Curvature:        Appears normal  Lumbar:                Limited  Head/Neck  Lips:                  Appears normal         Profile:                Appears  normal  Neck:                  Appears normal         Orbits/Eyes:            Appears normal  Nuchal Fold:           Not applicable         Mandible:               Not well visualized  Nasal Bone:            Present                Maxilla:                Not well visualized  Thorax  4 Chamber View:        Not well visualized    Interventr. Septum:     Not well visualized  Cardiac Rhythm:        Normal                 Cardiac Axis:           Normal  Cardiac Situs:         Appears normal         Diaphragm:              Appears normal  Rt Outflow Tract:      Not well visualized    3 Vessel View:          Not well visualized  Lt Outflow Tract:      Appears normal         3 V Trachea View:       Appears normal  Aortic Arch:           Appears normal         IVC:                    Not well visualized  Ductal Arch:           Not well visualized    Crossing:               Not well visualized  SVC:                   Not well visualized  Abdomen  Ventral Wall:          Not well visualized    Lt Kidney:              Appears normal  Cord Insertion:        Not well visualized    Rt Kidney:  Appears normal  Situs:                 Appears normal         Bladder:                Appears normal  Stomach:               Appears normal  Extremities  Lt Humerus:            Not well visualized    Lt Femur:               Appears normal  Rt Humerus:            Appears normal         Rt Femur:               Appears normal  Lt Forearm:            Appears normal         Lt Lower Leg:           Appears normal  Rt Forearm:            Appears normal         Rt Lower Leg:           Appears normal  Lt Hand:               Open hand nml          Lt Foot:                Foot visualized  Rt Hand:               Not well visualized    Rt Foot:                Foot visualized  Other  Umbilical Cord:        Normal 3-vessel        Genitalia:              Female-nml  Comment:     Technically difficult due to maternal habitus and fetal position.  ---------------------------------------------------------------------- Cervix Uterus Adnexa  Cervix  Length:            4.5  cm.  Measured transvaginally  Uterus  No abnormality visualized.  Right Ovary  Size(cm)     2.01   x   1.73   x  1.68      Vol(ml): 3.06  Within normal limits.  Left Ovary  Not visualized.  Cul De Sac  No free fluid seen.  Adnexa  No abnormality visualized ---------------------------------------------------------------------- Comments  Hospital Ultrasound  The patient presented to the MAU for vaginal bleeding in the  setting of a low-lying placenta. She is now admitted vaginal  bleeding.  Sonographic findings  Single intrauterine pregnancy at 28w 3d  Fetal cardiac activity: Observed and appears normal.  Presentation: Cephalic.  Limited fetal anatomy appears normal.  Amniotic fluid volume: Within normal limits.  MVP: 6.8 cm.  Placenta: Anterior, low-lying, 1.6 cm from int os. There is  mixed echgenicity in the cervix suggestive of organized blood  clot. This makes it difficult to appreciate the leading edge of  the placenta but it appears to be at least 1-1.5 cm away from  the internal os.  Recommendations  - See Epic for recomendations  This was a limited ultrasound with a remote read. If an official  MFM consult is requested for any reason  please call/place an  order in Epic. ----------------------------------------------------------------------                   Braxton Feathers, DO Electronically Signed Final Report   01/08/2023 09:33 am ----------------------------------------------------------------------   [place] MFM OB LIMITED Result Date: 01/07/2023 ----------------------------------------------------------------------  OBSTETRICS REPORT                       (Signed Final 01/07/2023 09:45 am) ---------------------------------------------------------------------- Patient Info  ID #:       [id number]                          D.O.B.:  Jul 04, 1990 (32 yrs)(F)  Name:       Leslie Jester Popson               Visit Date: 01/06/2023 03:10 pm ---------------------------------------------------------------------- Performed By  Attending:        Braxton Feathers DO       Referred By:      [facility] MAU/Triage  Performed By:     Isabel Caprice        [facility] ---------------------------------------------------------------------- Orders  #  Description                           Code        Ordered By  1  [place] MFM OB LIMITED                     47829.56    ERIN LAWRENCE ----------------------------------------------------------------------  #  Order #                     Accession #                Episode #  1  [id number]                   [id number]                 [id number] ---------------------------------------------------------------------- Indications  Vaginal bleeding in pregnancy, second          O46.92  trimester  Low lying placenta, antepartum                 O44.40  [redacted] weeks gestation of pregnancy                Z3A.28 ---------------------------------------------------------------------- Fetal Evaluation  Num Of Fetuses:         1  Fetal Heart Rate(bpm):  153  Cardiac Activity:       Observed  Presentation:           Cephalic  Placenta:               Anterior  Amniotic Fluid  AFI FV:      Within normal limits  AFI Sum(cm)     %Tile       Largest Pocket(cm)  17              63  5.6  RUQ(cm)       RLQ(cm)       LUQ(cm)        LLQ(cm)  4.6           4.5           2.3            5.6 ---------------------------------------------------------------------- OB History  Gravidity:    2         Term:   0        Prem:   0        SAB:   0  TOP:          0       Ectopic:  1        Living: 0 ---------------------------------------------------------------------- Gestational Age  Clinical EDD:  28w 1d                                        EDD:   03/30/23  Best:          28w 1d     Det. By:  Clinical EDD             EDD:   03/30/23  ---------------------------------------------------------------------- Anatomy  Heart:                 Visualized             Abdominal Wall:         Appears nml (cord                                                                        insert, abd wall)  Diaphragm:             Appears normal         Cord Vessels:           Appears normal (3                                                                        vessel cord)  Stomach:               Appears normal, left   Kidneys:                Appear normal                         sided  Abdomen:               Appears normal         Bladder:                Appears normal ---------------------------------------------------------------------- Comments  Hospital Ultrasound  The patient presented to the MAU for vaginal bleeding. She  was admitted on 12/26/22 for vaginal bleeding in the setting of  a low lying placenta. She represented  for vaginal bleeding.  Sonographic findings  Single intrauterine pregnancy at 28w 1d  Fetal cardiac activity: Observed and appears normal.  Presentation: Cephalic.  Limited fetal anatomy appears normal. The measurement of  the fetal BPD is incorrect and should not be used.  Amniotic fluid volume: Within normal limits. AFI: 17. cm.  MVP: 5.6 cm.  Placenta: Low lying is suspected but the imaging quality is  insufficient to determine the leading edge of the placenta.  The placenta is suboptimally imaged and placental  hematoma cannot be ruled out.  Recommendations  - EDD is 03/30/2023 dated by by Clinical EDD.  - The quality of the [place] is insufficent to identify the leading  edge of the placenta to determine if the placenta is low lying.  Transvaginal ultrasound is recommended if the patient  continues to have vaginal bleeding and the leading edge of  the placenta is not identifed on transabdominal imaging.  Future ultrasounds should also assess for placenta  hematoma.  - Continue clinical management per OB provider.  This was a limited  ultrasound with a remote read. If an official  MFM consult is requested for any reason please call/place an  order in Epic. ----------------------------------------------------------------------                   Braxton Feathers, DO Electronically Signed Final Report   01/07/2023 09:45 am ----------------------------------------------------------------------   [place] MFM OB LIMITED Result Date: 12/27/2022 ----------------------------------------------------------------------  OBSTETRICS REPORT                       (Signed Final 12/27/2022 10:01 am) ---------------------------------------------------------------------- Patient Info  ID #:       [id number]                          D.O.B.:  02-Sep-1990 (32 yrs)(F)  Name:       Leslie Jester Ferreri              Visit Date: 12/26/2022 06:43 pm ---------------------------------------------------------------------- Performed By  Attending:        Lin Landsman      Referred By:      [facility] MAU/Triage                    MD  Performed By:     Percell Boston          [facility] ---------------------------------------------------------------------- Orders  #  Description                           Code        Ordered By  1  [place] MFM OB LIMITED                     37628.31    Cala Bradford [place] ----------------------------------------------------------------------  #  Order #                     Accession #  Episode #  1  [id number]                   [id number]                 [id number] ---------------------------------------------------------------------- Indications  [redacted] weeks gestation of pregnancy                Z3A.26  Vaginal bleeding in pregnancy, second          O46.92  trimester  Encounter for antenatal screening,             Z36.9  unspecified ---------------------------------------------------------------------- Fetal Evaluation  Num Of Fetuses:         1  Fetal Heart  Rate(bpm):  151  Cardiac Activity:       Observed  Presentation:           Cephalic  Placenta:               Anterior, low-lying, 1.9 cm from int os  P. Cord Insertion:      Visualized, central  Amniotic Fluid  AFI FV:      Within normal limits                              Largest Pocket(cm)                              7 ---------------------------------------------------------------------- OB History  Gravidity:    2         Term:   0        Prem:   0        SAB:   0  TOP:          0       Ectopic:  1        Living: 0 ---------------------------------------------------------------------- Gestational Age  Clinical EDD:  26w 4d                                        EDD:   03/30/23  Best:          26w 4d     Det. By:  Clinical EDD             EDD:   03/30/23 ---------------------------------------------------------------------- Anatomy  Cranium:               Appears normal         Diaphragm:              Appears normal  Ventricles:            Appears normal         Stomach:                Appears normal, left                                                                        sided  Thoracic:              Appears normal  Bladder:                Appears normal ---------------------------------------------------------------------- Cervix Uterus Adnexa  Cervix  Normal appearance by transabdominal scan  Uterus  No abnormality visualized.  Right Ovary  Not visualized.  Left Ovary  Not visualized.  Cul De Sac  No free fluid seen.  Adnexa  No abnormality visualized ---------------------------------------------------------------------- Impression  Limited exam due to vaginal bleeding  Good fetal movement and amniotic fluid.  Low lying placenta noted at 1.9 cm there was not evidence of  previa ---------------------------------------------------------------------- Recommendations  Clinical correlation recommended. ----------------------------------------------------------------------              Lin Landsman, MD  Electronically Signed Final Report   12/27/2022 10:01 am ----------------------------------------------------------------------    MAU Management/MDM: Orders Placed This Encounter  Procedures   [place] MFM OB LIMITED    No orders of the defined types were placed in this encounter.    Available prenatal records reviewed.  Known low-lying placenta with previous admission. No active bleeding currently, reassuringly reactive fetal tracing. Patient hesitant for admission. Case discussed with on-call provider, Dr. Reina Fuse. She plans to visit patient in person. Additionally requested repeat U/S to assess for abruption as well as if prior clot in cervix has passed.  Dr. Reina Fuse bedside to talk to patient after ultrasound results. Patient elects to be admitted.  ASSESSMENT 1. Vagina bleeding   2. Low lying placenta with hemorrhage, second trimester   3. [redacted] weeks gestation of pregnancy     PLAN Admit to [facility]. Dr. Reina Fuse to place admit orders.  Wylene Simmer, MD OB Fellow 01/10/2023  12:40 PM

## 2023-01-11 NOTE — Progress Notes (Signed)
Leslie Guerrero is a 32yo G2P0010 @ 28 6/7 by LMP c/w 9wk TVUS admitted with recurrent VB in setting of known low-lying placenta  Subjective: Doing well. Has had a small amount of light red spotting, but no frank bleeding or clots. Denies LOF, dFM, and contractions. Denies abdominal pain. Discussed plan at length.  Objective:    01/11/2023   11:56 AM 01/11/2023    8:36 AM 01/11/2023    3:51 AM  Vitals with BMI  Systolic 113 108 161  Diastolic 73 59 60  Pulse 107 109 104     FHT:  FHR: 150 bpm, variability: moderate,  accelerations:  Present,  decelerations:  Absent UC:   none SVE:   deferred  Labs: Lab Results  Component Value Date   WBC 13.7 (H) 01/10/2023   HGB 11.9 (L) 01/10/2023   HCT 36.2 01/10/2023   MCV 91.6 01/10/2023   PLT 373 01/10/2023    Assessment / Plan: Leslie Guerrero is a 32yo G2P0010 @ 28 6/7 by LMP c/w 9wk TVUS admitted with recurrent VB in setting of known low-lying placenta. Rh neg status, s/p Rhogam at 26wks with first VB admission            MFM scan 12/18 as well as abdominal exam are of low concern for possible abruption/retroplacental clot. Blood clot within cervix on TVUS from 12/16 while in-patient. Suspect, based on patient history and overall benign/reassuring exam, that clot extruded prior to this admission. Closed cervix, vertex IUP on admit.   Given recurrent bleeding event, plan for 48hr admission. Already has outpatient clinic visit on 12/26 -S/p both MFM and NICU consults already -S/p BMTZ 12/3-4 -MFM scan 12/18, no TVUS repeat given 12/16 exam.  -RH neg: s/p Rhogam at 26wks -Admit CBC, coags, KBT normal. T&S collected on admission -3x/d monitoring, up ad lib, regular diet   If no further significant VB, plan for DC afternoon of 12/20.  Willa Frater, MD 01/11/2023, 2:01 PM

## 2023-01-12 MED ORDER — HYDROXYZINE HCL 50 MG PO TABS: 25.0000 mg | ORAL_TABLET | Freq: Three times a day (TID) | ORAL | Status: DC | PRN

## 2023-01-12 MED ORDER — HYDROXYZINE HCL 25 MG PO TABS: 25.0000 mg | ORAL_TABLET | Freq: Three times a day (TID) | ORAL | 1 refills | Status: DC | PRN

## 2023-01-12 MED ORDER — BUTALBITAL-APAP-CAFFEINE 50-325-40 MG PO TABS
1.0000 | ORAL_TABLET | ORAL | Status: DC | PRN
  Administered 2023-01-12: 1 via ORAL
  Filled 2023-01-12: qty 1

## 2023-01-12 NOTE — Progress Notes (Signed)
   01/12/23 1620  Departure Condition  Departure Condition Good  Mobility at [facility]  Patient/Caregiver Teaching Teach Back Method Used;Discharge instructions reviewed;Prescriptions reviewed;Follow-up care reviewed;Patient/caregiver verbalized understanding  Departure Mode With significant other  Was procedural sedation performed on this patient during this visit? No   Patient alert and oriented x4, VS and pain stable.

## 2023-01-12 NOTE — Discharge Instructions (Signed)
Call for increased bleeding.

## 2023-01-12 NOTE — Discharge Summary (Signed)
Physician Discharge Summary  Patient ID: Leslie Guerrero MRN: [id number] DOB/AGE: 1990/10/17 32 y.o.  Admit date: 01/10/2023 Discharge date: 01/12/2023  Admission Diagnoses:  IUP at 28 weeks, low-lying placenta, vaginal bleeding  Discharge Diagnoses: IUP at 29 weeks, low-lying placenta, vaginal bleeding Principal Problem:   Vaginal bleeding in pregnancy, third trimester Active Problems:   Low-lying placenta   Discharged Condition: good  Hospital Course: She was readmitted on 12-18 after being discharged on 12-17 for recurrent vaginal bleeding.  Abdominal u/s with no evidence of previa or abruption, previously seen possible clot in cervix has resolved.  Her bleeding significantly improved, she was not in labor, FHT reactive, stable for discharge on the afternoon of 12-20  Discharge Exam: Blood pressure 111/68, pulse (!) 114, temperature 98.3 F (36.8 C), temperature source Oral, resp. rate 18, height 5\' 2"  (1.575 m), weight 101.5 kg, last menstrual period 06/23/2022, SpO2 100%. General appearance: alert  Disposition: Discharge disposition: 01-Home or Self Care       Discharge Instructions     Discharge activity:   Complete by: As directed    No strenuous activity   Notify physician for leaking of fluid   Complete by: As directed    Notify physician for uterine contractions.  These may be painless and feel like the uterus is tightening or the baby is  "balling up"   Complete by: As directed    Notify physician for vaginal bleeding   Complete by: As directed    PRETERM LABOR:  Includes any of the follwing symptoms that occur between 20 - [redacted] weeks gestation.  If these symptoms are not stopped, preterm labor can result in preterm delivery, placing your baby at risk   Complete by: As directed    Sexual Activity:     Complete by: As directed    Pelvic rest      Allergies as of 01/12/2023       Reactions   Fluoxetine Other (See Comments)   "Gave me a headache and I  felt badly"   Lavender Oil Hives   Shellfish-derived Products Hives, Itching, Other (See Comments)   Sweating, also (no breathing issues, however)   Nickel Rash        Medication List     TAKE these medications    acetaminophen 325 MG tablet Commonly known as: TYLENOL Take 650 mg by mouth every 6 (six) hours as needed for mild pain (pain score 1-3).   butalbital-acetaminophen-caffeine 50-325-40 MG tablet Commonly known as: FIORICET Take 2 tablets by mouth 2 (two) times daily as needed for headache.   hydrOXYzine 25 MG tablet Commonly known as: ATARAX Take 1 tablet (25 mg total) by mouth 3 (three) times daily as needed for anxiety.   prenatal multivitamin Tabs tablet Take 1 tablet by mouth daily at 12 noon.         Signed: Leighton Roach Zykeria Laguardia 01/12/2023, 7:48 PM

## 2023-01-12 NOTE — Progress Notes (Signed)
HD #3, [redacted]W[redacted]D, [facility] with bleeding  Doing ok, small amount of blood in toilet and when wiped today, no ctx or LOF, normal FM Afeb, VSS FHT reactive Fundus NT  Will continue to monitor today, consider discharge later today if no significant bleeding

## 2023-01-25 ENCOUNTER — Encounter (HOSPITAL_COMMUNITY): Payer: Self-pay | Admitting: Obstetrics and Gynecology

## 2023-01-25 ENCOUNTER — Inpatient Hospital Stay (HOSPITAL_COMMUNITY)
Admission: AD | Admit: 2023-01-25 | Discharge: 2023-01-30 | DRG: 833 | Disposition: A | Discharge status: Home / Self Care | Payer: 59 | Attending: Obstetrics and Gynecology | Admitting: Obstetrics and Gynecology

## 2023-01-25 ENCOUNTER — Inpatient Hospital Stay (HOSPITAL_COMMUNITY): Payer: 59

## 2023-01-25 ENCOUNTER — Other Ambulatory Visit: Payer: Self-pay

## 2023-01-25 DIAGNOSIS — O26893 Other specified pregnancy related conditions, third trimester: Secondary | ICD-10-CM | POA: Diagnosis present

## 2023-01-25 DIAGNOSIS — O99353 Diseases of the nervous system complicating pregnancy, third trimester: Secondary | ICD-10-CM | POA: Diagnosis present

## 2023-01-25 DIAGNOSIS — E669 Obesity, unspecified: Secondary | ICD-10-CM | POA: Diagnosis not present

## 2023-01-25 DIAGNOSIS — Z3A30 30 weeks gestation of pregnancy: Secondary | ICD-10-CM

## 2023-01-25 DIAGNOSIS — G90A Postural orthostatic tachycardia syndrome (POTS): Secondary | ICD-10-CM | POA: Diagnosis present

## 2023-01-25 DIAGNOSIS — L539 Erythematous condition, unspecified: Secondary | ICD-10-CM | POA: Diagnosis not present

## 2023-01-25 DIAGNOSIS — Z833 Family history of diabetes mellitus: Secondary | ICD-10-CM

## 2023-01-25 DIAGNOSIS — O99213 Obesity complicating pregnancy, third trimester: Secondary | ICD-10-CM | POA: Diagnosis not present

## 2023-01-25 DIAGNOSIS — O368330 Maternal care for abnormalities of the fetal heart rate or rhythm, third trimester, not applicable or unspecified: Secondary | ICD-10-CM | POA: Diagnosis present

## 2023-01-25 DIAGNOSIS — Z3A31 31 weeks gestation of pregnancy: Secondary | ICD-10-CM | POA: Diagnosis not present

## 2023-01-25 DIAGNOSIS — O4453 Low lying placenta with hemorrhage, third trimester: Principal | ICD-10-CM | POA: Diagnosis present

## 2023-01-25 DIAGNOSIS — O4443 Low lying placenta NOS or without hemorrhage, third trimester: Secondary | ICD-10-CM

## 2023-01-25 DIAGNOSIS — Z6791 Unspecified blood type, Rh negative: Secondary | ICD-10-CM

## 2023-01-25 DIAGNOSIS — Z8249 Family history of ischemic heart disease and other diseases of the circulatory system: Secondary | ICD-10-CM

## 2023-01-25 DIAGNOSIS — O4693 Antepartum hemorrhage, unspecified, third trimester: Secondary | ICD-10-CM | POA: Diagnosis not present

## 2023-01-25 DIAGNOSIS — R21 Rash and other nonspecific skin eruption: Secondary | ICD-10-CM | POA: Diagnosis not present

## 2023-01-25 DIAGNOSIS — O4690 Antepartum hemorrhage, unspecified, unspecified trimester: Principal | ICD-10-CM

## 2023-01-25 LAB — URINALYSIS, ROUTINE W REFLEX MICROSCOPIC
Bilirubin Urine: NEGATIVE
Glucose, UA: NEGATIVE mg/dL
Ketones, ur: NEGATIVE mg/dL
Leukocytes,Ua: NEGATIVE
Nitrite: NEGATIVE
Protein, ur: 100 mg/dL — AB
Specific Gravity, Urine: 1.004 — ABNORMAL LOW (ref 1.005–1.030)
pH: 7 (ref 5.0–8.0)

## 2023-01-25 LAB — PROTIME-INR
INR: 1 (ref 0.8–1.2)
Prothrombin Time: 13.6 s (ref 11.4–15.2)

## 2023-01-25 LAB — FIBRINOGEN: Fibrinogen: 546 mg/dL — ABNORMAL HIGH (ref 210–475)

## 2023-01-25 LAB — CBC
HCT: 34.1 % — ABNORMAL LOW (ref 36.0–46.0)
Hemoglobin: 11.5 g/dL — ABNORMAL LOW (ref 12.0–15.0)
MCH: 30 pg (ref 26.0–34.0)
MCHC: 33.7 g/dL (ref 30.0–36.0)
MCV: 89 fL (ref 80.0–100.0)
Platelets: 385 10*3/uL (ref 150–400)
RBC: 3.83 MIL/uL — ABNORMAL LOW (ref 3.87–5.11)
RDW: 13.6 % (ref 11.5–15.5)
WBC: 13.8 10*3/uL — ABNORMAL HIGH (ref 4.0–10.5)
nRBC: 0 % (ref 0.0–0.2)

## 2023-01-25 LAB — KLEIHAUER-BETKE STAIN
# Vials RhIg: 1
Fetal Cells %: 0 %
Quantitation Fetal Hemoglobin: 0 mL

## 2023-01-25 LAB — TYPE AND SCREEN
ABO/RH(D): AB NEG
Antibody Screen: POSITIVE

## 2023-01-25 LAB — APTT: aPTT: 26 s (ref 24–36)

## 2023-01-25 MED ORDER — PRENATAL MULTIVITAMIN CH
1.0000 | ORAL_TABLET | Freq: Every day | ORAL | Status: DC
  Administered 2023-01-25 – 2023-01-30 (×6): 1 via ORAL
  Filled 2023-01-25 (×6): qty 1

## 2023-01-25 MED ORDER — BETAMETHASONE SOD PHOS & ACET 6 (3-3) MG/ML IJ SUSP
12.0000 mg | INTRAMUSCULAR | Status: AC
  Administered 2023-01-25 – 2023-01-26 (×2): 12 mg via INTRAMUSCULAR
  Filled 2023-01-25: qty 5

## 2023-01-25 MED ORDER — ACETAMINOPHEN 325 MG PO TABS: 650.0000 mg | ORAL_TABLET | ORAL | Status: DC | PRN

## 2023-01-25 MED ORDER — CALCIUM CARBONATE ANTACID 500 MG PO CHEW: 2.0000 | CHEWABLE_TABLET | ORAL | Status: DC | PRN

## 2023-01-25 MED ORDER — LACTATED RINGERS IV SOLN: 125.0000 mL/h | INTRAVENOUS | Status: AC

## 2023-01-25 MED ORDER — DOCUSATE SODIUM 100 MG PO CAPS
100.0000 mg | ORAL_CAPSULE | Freq: Every day | ORAL | Status: DC
  Administered 2023-01-26 – 2023-01-30 (×3): 100 mg via ORAL
  Filled 2023-01-25 (×5): qty 1

## 2023-01-25 NOTE — MAU Provider Note (Addendum)
 Chief Complaint:  Vaginal Bleeding   HPI   None     Leslie Guerrero is a 33 y.o. G2P0010 at [redacted]w[redacted]d who presents to maternity admissions reporting vaginal bleeding since yesterday 01/24/23 intermittently. Reports she had a clot the size of a large baby carrot and then spotting briefly and then no bleeding until this morning 01/25/23 at 0130 with a large gush of blood. This episode then tapered off, but is still bleeding and describes light to moderate bleeding. Reports that this is her 4th episode of bleeding this pregnancy. Reports that she has been hospitalized 3 times this pregnancy.  Reports that the first baby carrot sized clot was anticipated by OB due to previous admission.  Endorses regular and vigorous fetal movement. Denies pain or contractions.    Pregnancy Course: Low lying placenta with several admissions.   Past Medical History:  Diagnosis Date   Migraines    POTS (postural orthostatic tachycardia syndrome)    Thrombocytosis 05/2011   platelet count 531,000   OB History  Gravida Para Term Preterm AB Living  2    1   SAB IAB Ectopic Multiple Live Births    1      # Outcome Date GA Lbr Len/2nd Weight Sex Type Anes PTL Lv  2 Current           1 Ectopic            Past Surgical History:  Procedure Laterality Date   DIAGNOSTIC LAPAROSCOPY WITH REMOVAL OF ECTOPIC PREGNANCY N/A 10/17/2020   Procedure: DIAGNOSTIC LAPAROSCOPY WITH REMOVAL OF ECTOPIC PREGNANCY;  Surgeon: Lavoie, Marie-Lyne, MD;  Location: [facility] OR;  Service: Gynecology;  Laterality: N/A;   Fatty mass removed     LAPAROSCOPIC UNILATERAL SALPINGECTOMY Right 10/17/2020   Procedure: LAPAROSCOPIC RIGHT SALPINGECTOMY, EVACUATION OF HEMOPERITONEUM;  Surgeon: Lavoie, Marie-Lyne, MD;  Location: [facility] OR;  Service: Gynecology;  Laterality: Right;   Nexplanon  insertion  04/25/2016   Family History  Problem Relation Age of Onset   Hypertension Mother    Diabetes Mother    Fibromyalgia Mother    Migraines Mother     Hypertension Father    Heart attack Father    Diabetes Paternal Uncle    Breast cancer Paternal Grandmother    Diabetes Paternal Grandfather    Stroke Paternal Grandfather    Social History   Tobacco Use   Smoking status: Never   Smokeless tobacco: Never  Vaping Use   Vaping status: Never Used  Substance Use Topics   Alcohol use: Not Currently    Alcohol/week: 1.0 standard drink of alcohol    Types: 1 Standard drinks or equivalent per week    Comment: socially   Drug use: No   Allergies  Allergen Reactions   Fluoxetine Other (See Comments)    Gave me a headache and I felt badly   Lavender Oil Hives   Shellfish-Derived Products Hives, Itching and Other (See Comments)    Sweating, also (no breathing issues, however)   Nickel Rash   Medications Prior to Admission  Medication Sig Dispense Refill Last Dose/Taking   hydrOXYzine  (ATARAX ) 25 MG tablet Take 1 tablet (25 mg total) by mouth 3 (three) times daily as needed for anxiety. 60 tablet 1 01/24/2023   Prenatal Vit-Fe Fumarate-FA (PRENATAL MULTIVITAMIN) TABS tablet Take 1 tablet by mouth daily at 12 noon.   01/24/2023   acetaminophen  (TYLENOL ) 325 MG tablet Take 650 mg by mouth every 6 (six) hours as needed for mild pain (pain  score 1-3).      butalbital -acetaminophen -caffeine  (FIORICET ) 50-325-40 MG tablet Take 2 tablets by mouth 2 (two) times daily as needed for headache.       I have reviewed patient's Past Medical Hx, Surgical Hx, Family Hx, Social Hx, medications and allergies.   ROS  Pertinent items noted in HPI and remainder of comprehensive ROS otherwise negative.   PHYSICAL EXAM  Patient Vitals for the past 24 hrs:  BP Temp Temp src Pulse Resp SpO2 Height Weight  01/25/23 0242 127/84 98 F (36.7 C) Oral (!) 123 20 98 % 5' 2 (1.575 m) 104.2 kg    Constitutional: Well-developed, well-nourished female in no acute distress.  Cardiovascular: normal rate & rhythm, warm and well-perfused Respiratory: normal effort, no  problems with respiration noted GI: Abd soft, non-tender, non-distended MS: Extremities nontender, no edema, normal ROM Neurologic: Alert and oriented x 4.  GU: no CVA tenderness Pelvic: NEFG, physiologic discharge, no blood, cervix clean.      Fetal Tracing: Baseline: 145 Variability:mod Accelerations: 15x15 Decelerations: questionable variable x1  Toco: quiet   Labs: Results for orders placed or performed during the hospital encounter of 01/25/23 (from the past 24 hours)  Urinalysis, Routine w reflex microscopic -Urine, Clean Catch     Status: Abnormal   Collection Time: 01/25/23  3:00 AM  Result Value Ref Range   Color, Urine AMBER (A) YELLOW   APPearance CLEAR CLEAR   Specific Gravity, Urine 1.004 (L) 1.005 - 1.030   pH 7.0 5.0 - 8.0   Glucose, UA NEGATIVE NEGATIVE mg/dL   Hgb urine dipstick MODERATE (A) NEGATIVE   Bilirubin Urine NEGATIVE NEGATIVE   Ketones, ur NEGATIVE NEGATIVE mg/dL   Protein, ur 899 (A) NEGATIVE mg/dL   Nitrite NEGATIVE NEGATIVE   Leukocytes,Ua NEGATIVE NEGATIVE   RBC / HPF 11-20 0 - 5 RBC/hpf   WBC, UA 0-5 0 - 5 WBC/hpf   Bacteria, UA RARE (A) NONE SEEN   Squamous Epithelial / HPF 0-5 0 - 5 /HPF   Mucus PRESENT     Imaging:  Limited BSUS conducted to verify vertex presentation only. Patient informed this is a limited US  only for presentation.   MDM & MAU COURSE  MDM: Reviewed history Consulted with Dr. Fredirick and provided recommendation for admission to Dr. Sudie  MAU Course: Orders Placed This Encounter  Procedures   Urinalysis, Routine w reflex microscopic -Urine, Clean Catch   No orders of the defined types were placed in this encounter.   ASSESSMENT   1. Vaginal bleeding in pregnancy   2. Low lying placenta with hemorrhage in third trimester, antepartum   3. [redacted] weeks gestation of pregnancy     PLAN   Admit to [facility]. Dr. Sudie to assume care and place orders.    Camie Rote, MSN, CNM, RNC-OB Certified  Nurse Midwife, [facility] 01/25/2023 4:22 AM

## 2023-01-25 NOTE — H&P (Signed)
 Patient initially seen and evaluated by CNM Chief Complaint:  Vaginal Bleeding     HPI   None      Leslie Guerrero is a 33 y.o. G2P0010 at [redacted]w[redacted]d who presents to maternity admissions reporting vaginal bleeding since yesterday 01/24/23 intermittently. Reports she had a clot the size of a large baby carrot and then spotting briefly and then no bleeding until this morning 01/25/23 at 0130 with a large gush of blood. This episode then tapered off, but is still bleeding and describes light to moderate bleeding. Reports that this is her 4th episode of bleeding this pregnancy. Reports that she has been hospitalized 3 times this pregnancy.  Reports that the first baby carrot sized clot was anticipated by OB due to previous admission.  Endorses regular and vigorous fetal movement. Denies pain or contractions.     Pregnancy Course: Low lying placenta with several admissions.        Past Medical History:  Diagnosis Date   Migraines     POTS (postural orthostatic tachycardia syndrome)     Thrombocytosis 05/2011    platelet count 531,000                         OB History  Gravida Para Term Preterm AB Living   2       1     SAB IAB Ectopic Multiple Live Births          1             # Outcome Date GA Lbr Len/2nd Weight Sex Type Anes PTL Lv  2 Current                    1 Ectopic                           Past Surgical History:  Procedure Laterality Date   DIAGNOSTIC LAPAROSCOPY WITH REMOVAL OF ECTOPIC PREGNANCY N/A 10/17/2020    Procedure: DIAGNOSTIC LAPAROSCOPY WITH REMOVAL OF ECTOPIC PREGNANCY;  Surgeon: Lavoie, Marie-Lyne, MD;  Location: [facility] OR;  Service: Gynecology;  Laterality: N/A;   Fatty mass removed       LAPAROSCOPIC UNILATERAL SALPINGECTOMY Right 10/17/2020    Procedure: LAPAROSCOPIC RIGHT SALPINGECTOMY, EVACUATION OF HEMOPERITONEUM;  Surgeon: Lavoie, Marie-Lyne, MD;  Location: [facility] OR;  Service: Gynecology;  Laterality: Right;   Nexplanon  insertion   04/25/2016              Family History  Problem Relation Age of Onset   Hypertension Mother     Diabetes Mother     Fibromyalgia Mother     Migraines Mother     Hypertension Father     Heart attack Father     Diabetes Paternal Uncle     Breast cancer Paternal Grandmother     Diabetes Paternal Grandfather     Stroke Paternal Grandfather          Social History  Social History         Tobacco Use   Smoking status: Never   Smokeless tobacco: Never  Vaping Use   Vaping status: Never Used  Substance Use Topics   Alcohol use: Not Currently      Alcohol/week: 1.0 standard drink of alcohol      Types: 1 Standard drinks or equivalent per week      Comment: socially   Drug use: No  Allergies       Allergies  Allergen Reactions   Fluoxetine Other (See Comments)      Gave me a headache and I felt badly   Lavender Oil Hives   Shellfish-Derived Products Hives, Itching and Other (See Comments)      Sweating, also (no breathing issues, however)   Nickel Rash             Medications Prior to Admission  Medication Sig Dispense Refill Last Dose/Taking   hydrOXYzine  (ATARAX ) 25 MG tablet Take 1 tablet (25 mg total) by mouth 3 (three) times daily as needed for anxiety. 60 tablet 1 01/24/2023   Prenatal Vit-Fe Fumarate-FA (PRENATAL MULTIVITAMIN) TABS tablet Take 1 tablet by mouth daily at 12 noon.     01/24/2023   acetaminophen  (TYLENOL ) 325 MG tablet Take 650 mg by mouth every 6 (six) hours as needed for mild pain (pain score 1-3).         butalbital -acetaminophen -caffeine  (FIORICET ) 50-325-40 MG tablet Take 2 tablets by mouth 2 (two) times daily as needed for headache.                I have reviewed patient's Past Medical Hx, Surgical Hx, Family Hx, Social Hx, medications and allergies.    ROS  Pertinent items noted in HPI and remainder of comprehensive ROS otherwise negative.    PHYSICAL EXAM  Patient Vitals for the past 24 hrs:   BP Temp Temp src Pulse Resp SpO2 Height Weight  01/25/23 0242  127/84 98 F (36.7 C) Oral (!) 123 20 98 % 5' 2 (1.575 m) 104.2 kg      Constitutional: Well-developed, well-nourished female in no acute distress.  Cardiovascular: normal rate & rhythm, warm and well-perfused Respiratory: normal effort, no problems with respiration noted GI: Abd soft, non-tender, non-distended MS: Extremities nontender, no edema, normal ROM Neurologic: Alert and oriented x 4.  GU: no CVA tenderness Pelvic: NEFG, physiologic discharge, no blood, cervix clean.                  Fetal Tracing: Baseline: 145 Variability:mod Accelerations: 15x15 Decelerations: questionable variable x1  Toco: quiet   Labs: Lab Results Last 24 Hours       Results for orders placed or performed during the hospital encounter of 01/25/23 (from the past 24 hours)  Urinalysis, Routine w reflex microscopic -Urine, Clean Catch     Status: Abnormal    Collection Time: 01/25/23  3:00 AM  Result Value Ref Range    Color, Urine AMBER (A) YELLOW    APPearance CLEAR CLEAR    Specific Gravity, Urine 1.004 (L) 1.005 - 1.030    pH 7.0 5.0 - 8.0    Glucose, UA NEGATIVE NEGATIVE mg/dL    Hgb urine dipstick MODERATE (A) NEGATIVE    Bilirubin Urine NEGATIVE NEGATIVE    Ketones, ur NEGATIVE NEGATIVE mg/dL    Protein, ur 899 (A) NEGATIVE mg/dL    Nitrite NEGATIVE NEGATIVE    Leukocytes,Ua NEGATIVE NEGATIVE    RBC / HPF 11-20 0 - 5 RBC/hpf    WBC, UA 0-5 0 - 5 WBC/hpf    Bacteria, UA RARE (A) NONE SEEN    Squamous Epithelial / HPF 0-5 0 - 5 /HPF    Mucus PRESENT          Imaging:  No results found.   MDM & MAU COURSE  MDM: Reviewed history Consulted with Dr. Fredirick and provided recommendation for admission to Dr. Sudie   MAU  Course:    Orders Placed This Encounter  Procedures   Urinalysis, Routine w reflex microscopic -Urine, Clean Catch    No orders of the defined types were placed in this encounter.     ASSESSMENT    1. Vaginal bleeding in pregnancy   2. Low lying placenta  with hemorrhage in third trimester, antepartum   3. [redacted] weeks gestation of pregnancy       PLAN   Admit to [facility]. Dr. Sudie to assume care and place orders.      Camie Rote, MSN, CNM, RNC-OB Certified Nurse Midwife, [facility] 01/25/2023 4:22 AM  ADDENDUM: Agree with evaluation as above. Briefly, this is a 32yo G2P0010 @ 30 6/7 by LMP c/w 9wk TVUS admitted for repeat admission for episode of vaginal bleeding in setting of known low-lying placenta. Rh neg status, s/p Rhogam at 26wks.   This will be patient's third admission for monitoring, last was from 12/16-18. Was supposed to have placental re-evaluation in office on the 10th. Denies abdominal trauma, heavy lifting, recent intercourse prior to today's episode. No evidence of contractions on TOCO, patient is otherwise comfortable, low concern for preterm labor at this time Given >2wks since last admission, will repeat MFM and NICU consults (first done on 12/3-4 admission). Will also repeat BMTZ course as it is now since. Formal MFM scan ordered as well as CBC, coags, KBT and T&S. 3x/d monitoring, up ad lib, regular diet

## 2023-01-25 NOTE — Consult Note (Signed)
 [facility]  Prenatal Consult      01/25/2023   6:38 PM  I was asked by Dr. Lavonia Guppy to consult on this patient for possible preterm delivery. I had the pleasure of meeting with Leslie Guerrero and her husband, [name], today.   She is a 33 yo G2P0 female presenting at [redacted]w[redacted]d IUP with concerns for vaginal bleeding in setting of resolved low lying placenta. Pregnancy has also been complicated by Rh negative status (received Rhogam and steroids with prior bleeding. She has received one betamethasone  course 12/3-4 and is now receiving a rescue course. Most recent estimated fetal weight of 1145 grams on 12/16 at 106w5d (20%ile for GA). They have chosen to name their baby [name].   They had a previous NICU consult at 28wks. They did not have unanswered questions in regard to that consult, and were interested in hearing more about expectations now that the pregnancy has progressed further.   We discussed the possible needs for an infant born at this gestation and later. I explained that the neonatal intensive care team would be present for the delivery and outlined the likely delivery room course for this baby including routine resuscitation and NRP-guided approaches to the treatment of respiratory distress. We discussed the potential need for CPAP or mechanical ventilation and surfactant administration for respiratory distress, IV fluids pending establishment of enteral feeds, temperature support, and monitoring.   We discussed other common problems associated with prematurity including respiratory distress syndrome, feeding issues, and temperature regulation. I also discussed the potential risk of complications such as intracranial hemorrhage, retinopathy, chronic lung disease, and how these risks decrease with each additional week of gestation.  We discussed the average length of stay but I noted that the actual LOS would depend on the severity of problems encountered and  response to treatments.   Thank you for involving us  in the care of this patient. A member of our team will be available should the family have additional questions.   Candon Caras, MD Neonatal Medicine  I spent ~40 minutes in consultation time, of which 25 minutes was spent in direct face to face counseling.

## 2023-01-25 NOTE — Progress Notes (Addendum)
 Pt reports still had one small gush of bleeding - about a quarter size on pad- same pad since admission a few hours ago - and bleeding the gush was less than what initially brought her in.  She denies any cramping, discomfort or new concerns. She just returned from MFM US . She is appreciating FMs  VS: 123/85, 111, 18, 98.1  GEN - NAD, sitting up in bed eating  ABD - CW GA  EXT - mild edema only   Labs: 13.8>11.5<385, fibr 546, AB neg, + blood in urine            KB pending  A/P: 32yo G2P0010 female at 47 6/[redacted]wks ga with bleeding in the third trimester, known low lying placenta - HD#1 - This is the pt's 4th episode of bleeding with pregnancy but her 3rd hospitalization (discharged from MAU with last).  - Await results of MFM US  and consult to reassess placental position; vertex.              - ? Timing of delivery             - ?  Need for seond course of magnesium sulfate for neuroprotection - Pt on second course of BMZ:         - received at 0518am today ; next due 01/26/23 - NICU consult pending:  - RH neg: s/p rhogam at 26 weeks  - NST q shift and prn pt discomfort

## 2023-01-25 NOTE — Consult Note (Addendum)
 MFM Note  Leslie Guerrero is a 33 year old gravida 2 para 0 currently at 30 weeks and 6 days.  She was admitted overnight due to vaginal bleeding and passage of blood clots.  She reports that the blood clots are are maroon in color.    This is her third hospital admission due to vaginal bleeding.    A low-lying placenta was noted earlier in her pregnancy.    The patient has received a complete course of antenatal corticosteroids during her prior hospital admission.  She is receiving a rescue course of steroids now.    The patient's blood type is AB negative.  A Kleihauer-Betke test is pending.  She denies any lower abdominal cramping or contractions and denies any recent abdominal trauma.  An ultrasound performed today shows that the fetus is in the vertex presentation.  There was normal amniotic fluid noted on today's exam.  A normal appearing anterior placenta is noted.  There were no signs of retroplacental clots behind the placenta.    A transvaginal ultrasound performed today shows that the previously noted low-lying placenta has resolved.  The edge of the anterior placenta measures 3.41 cm away from the internal cervical os.  There were no signs of vasa previa noted today.    Possible blood clots were noted within the cervical canal.    The patient was advised that as the blood clots she has been passing are brown/maroon in color, this may be blood clots leftover from her prior bleed.  Due to the blood clots noted within the cervical canal, the patient was advised that I anticipate that she will continue to experience passage of blood clots for the next few days.  As this is her third admission for vaginal bleeding, she should be observed in the hospital for 5 to 7 days.    She should receive a complete course of rescue steroids.    Magnesium sulfate for fetal neuroprotection is only indicated should she require delivery prior to 32 weeks.  Should the patient remain stable, we  will repeat another transvaginal ultrasound early next week prior to discharge to assess the blood clots noted within the cervical canal.   She should continue daily fetal testing while hospitalized.   She should receive additional doses of RhoGAM should her Kleihauer-Betke test be positive.    The patient was advised that as the low-lying placenta/placenta previa has resolved, she may attempt a vaginal delivery at term.    The increased risk of PPROM in women who experience chronic vaginal bleeding was discussed.  I will see her again early next week prior to discharge.    The patient stated that all of her questions were answered today.

## 2023-01-25 NOTE — MAU Note (Addendum)
.  Leslie Guerrero is a 33 y.o. at [redacted]w[redacted]d here in MAU reporting: woke up around 0120 with VB - not a lot now, but it was a lot - states it was a gush then slowed down and now just spotting. Reports low lying placenta. Denies pain or LOF. +FM.   Onset of complaint: 0120 Pain score: 0 Vitals:   01/25/23 0242  BP: 127/84  Pulse: (!) 123  Resp: 20  Temp: 98 F (36.7 C)  SpO2: 98%     FHT:150 Lab orders placed from triage: UA

## 2023-01-26 MED ORDER — DOXYLAMINE SUCCINATE (SLEEP) 25 MG PO TABS
25.0000 mg | ORAL_TABLET | Freq: Every evening | ORAL | Status: DC | PRN
  Administered 2023-01-27 – 2023-01-29 (×4): 25 mg via ORAL
  Filled 2023-01-26 (×5): qty 1

## 2023-01-26 MED ORDER — ACETAMINOPHEN 500 MG PO TABS: 1000.0000 mg | ORAL_TABLET | Freq: Four times a day (QID) | ORAL | Status: DC | PRN

## 2023-01-26 NOTE — Progress Notes (Signed)
 Leslie Guerrero 33 y.o. G2P0010 at [redacted]w[redacted]d HD#1  admitted with recurrent vaginal bleeding in pregnancy S: Feeling well since admission.  Had a small spot of brown blood on pad this AM.  Denies cramping or contractions.  Good FM  O: Vitals:   01/25/23 0550 01/25/23 0855 01/25/23 1615 01/25/23 2008  BP: 123/85 111/66 116/77 115/70  Pulse: (!) 111 (!) 118 (!) 113 (!) 120  Resp: 18 18 18 17   Temp: 98.1 F (36.7 C) 98.3 F (36.8 C) 98.1 F (36.7 C) 98.5 F (36.9 C)  TempSrc: Oral Oral Oral Oral  SpO2: 100% 100% 99% 98%  Weight:      Height:       NAD Abdomen: gravid, soft, non-tender Ext: no edema  NST: yesterday PM baseline 140s, moderate variability, 15x15 accelerations, no decelerations, toco quiet   Ultrasound: 12/16: EFW 1145 gm (20%) 1/2: Anterior placenta, 3.41 cm from internal cervical os.  AFI 12.8, cephalic position  A/P: [name] 33 y.o. G2P0010 at [redacted]w[redacted]d HD#1 admitted with vaginal bleeding (3rd hospitalization this pregnancy)  Vaginal bleeding: previously identified low lying placenta has resolved on US  yesterday (>3 cm from os).  Hemoglobin on admission as stable at 11.5. There were no retroplacental clots, but transvaginal ultrasound noted possible blood clots within the cervical canal.  It is anticipated that she will continue to experience passage of these clots over the next several days.  Given this is her third admission for bleeding, MFM recommended continued inpatient monitoring for 5-7 days with repeat MFM evaluation early next week Prematurity.  Initial course of steroids on 12/3 and 12/4, rescue course 1/2 and 1/3.  S/p NICU consultation yesterday.  Consider magnesium sulfate for neuroprotection should delivery appear to be imminent and she remains < [redacted] weeks GA.  EFW on 12/16 was 1145 gm (20%) Rh negative: KB stain negative on admission.  Received rhogam on 12/3 Fetal status: continue daily fetal testing while hospitalized POTS: not on medication.   Baseline HR runs 90s-low 120s.  Asymptomatic from a tachycardia standpoint today     [facility] 01/26/23 7:34 AM

## 2023-01-27 MED ORDER — DIPHENHYDRAMINE-ZINC ACETATE 2-0.1 % EX CREA
TOPICAL_CREAM | Freq: Three times a day (TID) | CUTANEOUS | Status: DC | PRN
  Filled 2023-01-27: qty 28

## 2023-01-27 NOTE — Progress Notes (Signed)
 Terrye Emalyn Schou 33 y.o. G2P0010 at [redacted]w[redacted]d HD#3  admitted with recurrent vaginal bleeding in pregnancy S: Feeling well since admission.  Had a small spot of brown blood on pad this AM.  Denies abdominal tenderness, cramping or contractions.  Good FM. Denies lightheadedness or palpitations. Wanted to know if intercourse could help get the clot out of her cervix so she could go home sooner/not have more bleeding at home. I told her I could not recommend that. She reports a mildly pruritic rash  under breasts and on lower belly which she thinks may be due to all the US  gel being used for fetal monitoring.   O: Vitals:   01/26/23 1712 01/26/23 1957 01/26/23 2330 01/27/23 0936  BP: (!) 106/56 109/71 111/61 (!) 104/55  Pulse: (!) 117 (!) 102 91 (!) 112  Resp: 18 18 17 19   Temp: 98.2 F (36.8 C) 98.3 F (36.8 C) 98 F (36.7 C) 97.8 F (36.6 C)  TempSrc: Oral Oral Oral Oral  SpO2: 100% 100% 100% 99%  Weight:      Height:       NAD Well perfused Normal WOB on room air Abdomen: gravid, soft, non-tender. Mildly erythematous rash on upper and lower abdomen   Ext: no edema  NST: Baseline 140, moderate variability, 15x15 accelerations, rare variable decelerations, toco quiet. Appropriate for gestational age.    Ultrasound: 12/16: EFW 1145 gm (20%) 1/2: Anterior placenta, 3.41 cm from internal cervical os.  AFI 12.8, cephalic position  A/P: [name] 33 y.o. G2P0010 at [redacted]w[redacted]d HD#3  admitted with recurrent vaginal bleeding in pregnancy (3rd hospitalization this pregnancy)  Vaginal bleeding: previously identified low lying placenta has resolved on 1/2 US  (>3 cm from os).  Hemoglobin on admission as stable at 11.5. There were no retroplacental clots, but transvaginal ultrasound noted possible blood clots within the cervical canal.  It is anticipated that she will continue to experience passage of these clots over the next several days.  Given this is her third admission for bleeding, MFM  recommended continued inpatient monitoring for 5-7 days with repeat MFM evaluation early next week Prematurity.  Initial course of steroids on 12/3 and 12/4, rescue course 1/2 and 1/3.  S/p NICU consultation on 1/2.  Consider magnesium sulfate for neuroprotection should delivery appear to be imminent and she remains < [redacted] weeks GA.  EFW on 12/16 was 1145 gm (20%) Rh negative: KB stain negative on admission.  Received rhogam on 12/3 Fetal status: continue BID NSTs while hospitalized POTS: not on medication.  Baseline HR runs 90s-low 120s. Asymptomatic from a tachycardia standpoint today 6. Mildly pruritic rash: Benadryl  cream ordered    Bradden Tadros A Breckon Reeves 01/27/23 9:37 AM

## 2023-01-28 NOTE — Progress Notes (Signed)
 Rodney Mayu Ronk 33 y.o. G2P0010 at [redacted]w[redacted]d HD#4  admitted with recurrent vaginal bleeding in pregnancy S: Feeling well since admission.  Reports a small amount of brown discharge but no frank vaginal bleeding. Denies abdominal tenderness, cramping or contractions.  Good FM. Denies lightheadedness or palpitations. She reports her rash has improved with Benadryl  cream.  O: Vitals:   01/27/23 1544 01/27/23 1954 01/27/23 2211 01/28/23 0517  BP: 122/74 110/68 109/65 (!) 95/47  Pulse: (!) 118 95 100 93  Resp: 17 18  19   Temp: 97.8 F (36.6 C) 97.9 F (36.6 C)  98 F (36.7 C)  TempSrc: Oral Oral  Oral  SpO2: 100% 97%  98%  Weight:      Height:       NAD Well perfused Normal WOB on room air Abdomen: gravid, soft, non-tender. Significantly improved rash on upper and lower abdomen   Ext: no edema  Overnight NST: Baseline 145, moderate variability, 15x15 accelerations, no decelerations, toco quiet. Appropriate for gestational age.    Ultrasound: 12/16: EFW 1145 gm (20%) 1/2: Anterior placenta, 3.41 cm from internal cervical os.  AFI 12.8, cephalic position  A/P: [name] 33 y.o. G2P0010 at [redacted]w[redacted]d HD#4  admitted with recurrent vaginal bleeding in pregnancy (3rd hospitalization this pregnancy)  Vaginal bleeding: previously identified low lying placenta has resolved on 1/2 US  (>3 cm from os).  Hemoglobin on admission as stable at 11.5. There were no retroplacental clots, but transvaginal ultrasound noted possible blood clots within the cervical canal.  It is anticipated that she will continue to experience passage of these clots over the next several days.  Given this is her third admission for bleeding, MFM recommended continued inpatient monitoring for 5-7 days with repeat MFM evaluation early next week. Q72hr T&S ordered- to be collected today Prematurity.  Initial course of steroids on 12/3 and 12/4, rescue course 1/2 and 1/3.  S/p NICU consultation on 1/2.  Consider magnesium  sulfate for neuroprotection should delivery appear to be imminent and she remains < [redacted] weeks GA.  EFW on 12/16 was 1145 gm (20%) Rh negative: KB stain negative on admission.  Received rhogam on 12/3 Fetal status: continue BID NSTs while hospitalized POTS: not on medication.  Baseline HR runs 90s-low 120s. Asymptomatic from a tachycardia standpoint today 6. Mildly pruritic rash: Benadryl  cream ordered  Dispo: Continue current inpatient care.  Layani Foronda A Jaykob Minichiello 01/28/23 7:56 AM

## 2023-01-29 LAB — TYPE AND SCREEN
ABO/RH(D): AB NEG
Antibody Screen: POSITIVE

## 2023-01-29 NOTE — Progress Notes (Signed)
 Patient ID: Leslie Guerrero, female   DOB: 01-30-1990, 33 y.o.   MRN: [id number]    Leslie Guerrero 33 y.o. G2P0010 at 31.3 HD#5 admitted with recurrent vaginal bleeding in pregnancy  S: No complaints this morning. Had continued to have small brown discharge daily during admission, but no further bright red bleeding. +Fms. No cramping or abdominal pain   O: Vitals:   01/28/23 2102 01/29/23 0609  BP: 110/65 103/61  Pulse: (!) 106 97  Resp: 17 16  Temp: 98.1 F (36.7 C) 98.1 F (36.7 C)  SpO2: 100% 96%    Gen: awake, alert, NAD Abdomen: gravid, soft, non-tender Ext: no edema   NST (yesterday PM): baseline150s, moderate variability, 15x15 accels, no decelerations Reactive TOCO: none     Ultrasound: 12/16: EFW 1145 gm (20%) 1/2: Anterior placenta, 3.41 cm from internal cervical os.  AFI 12.8, cephalic position   A/P: Leslie Guerrero 33 y.o. G2P0010 at [redacted]w[redacted]d HD#5 admitted with vaginal bleeding (3rd hospitalization this pregnancy)   Vaginal bleeding: previously identified low lying placenta has resolved on US  yesterday (>3 cm from os).  Hemoglobin on admission as stable at 11.5. There were no retroplacental clots, but transvaginal ultrasound noted possible blood clots within the cervical canal.  It is anticipated that she will continue to experience passage of these clots over the next several days.  Given this is her third admission for bleeding, MFM recommended continued inpatient monitoring for 5-7 days with repeat MFM evaluation early this week. TVUS ordered tomorrow to assess clots Prematurity.  Initial course of steroids on 12/3 and 12/4, rescue course 1/2 and 1/3. Consider magnesium sulfate for neuroprotection should delivery appear to be imminent and she remains < [redacted] weeks GA.  EFW on 12/16 was 1145 gm (20%) Rh negative: KB stain negative on admission.  Received rhogam on 12/3 Fetal status: NST BID or more frequently as clinically indicated POTS: not on medication.   Baseline HR runs 90s-low 120s.  Asymptomatic  Mildly pruritic rash: improved today

## 2023-01-29 NOTE — Plan of Care (Signed)
  Problem: Education: Goal: Knowledge of disease or condition will improve Outcome: Progressing Goal: Knowledge of the prescribed therapeutic regimen will improve Outcome: Progressing Goal: Individualized Educational Video(s) Outcome: Progressing   Problem: Clinical Measurements: Goal: Complications related to the disease process, condition or treatment will be avoided or minimized Outcome: Progressing   Problem: Education: Goal: Knowledge of General Education information will improve Description: Including pain rating scale, medication(s)/side effects and non-pharmacologic comfort measures Outcome: Progressing   Problem: Health Behavior/Discharge Planning: Goal: Ability to manage health-related needs will improve Outcome: Progressing   Problem: Clinical Measurements: Goal: Ability to maintain clinical measurements within normal limits will improve Outcome: Progressing Goal: Will remain free from infection Outcome: Progressing Goal: Diagnostic test results will improve Outcome: Progressing Goal: Respiratory complications will improve Outcome: Progressing Goal: Cardiovascular complication will be avoided Outcome: Progressing   Problem: Activity: Goal: Risk for activity intolerance will decrease Outcome: Progressing   Problem: Nutrition: Goal: Adequate nutrition will be maintained Outcome: Progressing   Problem: Coping: Goal: Level of anxiety will decrease Outcome: Progressing   Problem: Elimination: Goal: Will not experience complications related to bowel motility Outcome: Progressing Goal: Will not experience complications related to urinary retention Outcome: Progressing   Problem: Pain Management: Goal: General experience of comfort will improve Outcome: Progressing   Problem: Safety: Goal: Ability to remain free from injury will improve Outcome: Progressing   Problem: Skin Integrity: Goal: Risk for impaired skin integrity will decrease Outcome:  Progressing

## 2023-01-29 NOTE — Progress Notes (Signed)
 12:33 Called by patient's RN re: increased bleeding. Noted small clots of bright red blood. No further bleeding after. NST/TOCO done which were reassuring and without CTX

## 2023-01-30 ENCOUNTER — Inpatient Hospital Stay (HOSPITAL_COMMUNITY): Payer: 59

## 2023-01-30 DIAGNOSIS — O99213 Obesity complicating pregnancy, third trimester: Secondary | ICD-10-CM

## 2023-01-30 DIAGNOSIS — Z3A31 31 weeks gestation of pregnancy: Secondary | ICD-10-CM | POA: Diagnosis not present

## 2023-01-30 DIAGNOSIS — O4693 Antepartum hemorrhage, unspecified, third trimester: Secondary | ICD-10-CM

## 2023-01-30 DIAGNOSIS — E669 Obesity, unspecified: Secondary | ICD-10-CM | POA: Diagnosis not present

## 2023-01-30 DIAGNOSIS — O4443 Low lying placenta NOS or without hemorrhage, third trimester: Secondary | ICD-10-CM

## 2023-01-30 NOTE — Progress Notes (Signed)
 Leslie Guerrero 33 y.o. G2P0010 at [redacted]w[redacted]d HD#6  admitted with recurrent vaginal bleeding in pregnancy S: Episode yesterday afternoon of passing small clots.  Had no associated cramping / contractions or fetal heart rated changes.  Since that time, just small brown discharge   O: Vitals:   01/30/23 0433 01/30/23 0434 01/30/23 0435 01/30/23 0745  BP: (!) 93/49  (!) 99/47 (!) 102/52  Pulse: (!) 103  (!) 105 (!) 106  Resp: 18   18  Temp: 97.9 F (36.6 C)   98.4 F (36.9 C)  TempSrc: Oral   Oral  SpO2: 96% 96%  97%  Weight:      Height:       NAD Abdomen: gravid, soft, non-tender.   Ext: no edema  NST: yesterday PM (AM NST still pending) Baseline 150s, moderate variability, 15x15 accelerations, rare variable decelerations, toco quiet.     Ultrasound: 12/16: EFW 1145 gm (20%) 1/2: Anterior placenta, 3.41 cm from internal cervical os.  AFI 12.8, cephalic position  A/P: Leslie Guerrero 33 y.o. G2P0010 at [redacted]w[redacted]d HD#6  admitted with recurrent vaginal bleeding in pregnancy (3rd hospitalization this pregnancy)  Vaginal bleeding: previously identified low lying placenta has resolved on 1/2 US  (>3 cm from os).  Hemoglobin on admission as stable at 11.5. There were no retroplacental clots, but transvaginal ultrasound noted possible blood clots within the cervical canal.  It is anticipated that she will continue to experience passage of these clots over the next several days.  Given this is her third admission for bleeding, MFM recommended continued inpatient monitoring for 5-7 days with repeat MFM.  Ordered for today Prematurity.  Initial course of steroids on 12/3 and 12/4, rescue course 1/2 and 1/3.  S/p NICU consultation on 1/2.  Consider magnesium sulfate for neuroprotection should delivery appear to be imminent and she remains < [redacted] weeks GA.  EFW on 12/16 was 1145 gm (20%) Rh negative: KB stain negative on admission.  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  Baseline HR runs 90s-low 120s. Asymptomatic from a tachycardia standpoint today 6. Mildly pruritic rash: Benadryl  cream ordered    [facility] GEFFEL Sebastion Jun 01/30/23 9:44 AM

## 2023-01-30 NOTE — Progress Notes (Signed)
   01/30/23 1454  Departure Condition  Departure Condition Good  Mobility at [facility]  Patient/Caregiver Teaching Teach Back Method Used;Discharge instructions reviewed;Follow-up care reviewed;Pain management discussed;Medications discussed;Patient/caregiver verbalized understanding  Departure Mode With significant other  Was procedural sedation performed on this patient during this visit? No   Patient alert and oriented x4, VS and pain stable at discharge.

## 2023-01-30 NOTE — Discharge Summary (Signed)
 Physician Discharge Summary  Patient ID: Leslie Guerrero MRN: [id number] DOB/AGE: 35/18/1992 35 y.o.  Admit date: 01/25/2023 Discharge date: 01/30/2023  Admission Diagnoses: Vaginal bleeding in third trimester  Discharge Diagnoses:  Principal Problem:   Vaginal bleeding in pregnancy, third trimester   Discharged Condition: good  Hospital Course: G2P0 presented to [facility] at [redacted]w[redacted]d with recurrent vaginal bleeding.  4th episode of bleeding during this pregnancy in the setting of a low lying placenta.  She was admitted to antepartum for observation.  She received a rescue course of steroids, NICU consultation. She received rhogam during her admission at 26 weeks.  This was not repeated as her KB was negative. MFM was consulted.  F/u US  showed resolution of the low lying placenta with a normal anterior placenta > 3 cm from the internal os with a long cervical length.  There were possible blood clots seen within the canal and above the cervical os.  She was monitored inpatient for 5 days.  During that time she had mostly dark brown discharge.  She passed some small clots on 1/6.  Repeat US  on 1/7 showed continued clot just inside internal os that was likely the cause of the continued light spotting.  Fetal monitoring was reassuring throughout admission.  MFM saw the patient again on 1/7 and felt that she was stable for discharge to home.  Plan for weekly NST, repeat growth US  in 4 weeks, and delivery at 37 weeks should she continue to pass brown clots.  If she were to have bright red bleeding, she should return to the hospital and delivery be considered if > 34 weeks.    Consults:  MFM  Significant Diagnostic Studies: ultrasound  Treatments: rescue course of betamethasone   Discharge Exam: Blood pressure 128/73, pulse (!) 119, temperature 98.5 F (36.9 C), temperature source Oral, resp. rate 18, height 5' 2 (1.575 m), weight 104.2 kg, last menstrual period 06/23/2022, SpO2 97%. NAD Abdomen: soft,  gravid, non-tender Ext: no lower extremity edema  Disposition: Discharge disposition: 01-Home or Self Care       Discharge Instructions     Discharge diet:  No restrictions   Complete by: As directed    Do not have sex or do anything that might make you have an orgasm   Complete by: As directed    Notify physician for a general feeling that something is not right   Complete by: As directed    Notify physician for increase or change in vaginal discharge   Complete by: As directed    Notify physician for intestinal cramps, with or without diarrhea, sometimes described as gas pain   Complete by: As directed    Notify physician for leaking of fluid   Complete by: As directed    Notify physician for low, dull backache, unrelieved by heat or Tylenol    Complete by: As directed    Notify physician for menstrual like cramps   Complete by: As directed    Notify physician for pelvic pressure   Complete by: As directed    Notify physician for uterine contractions.  These may be painless and feel like the uterus is tightening or the baby is  balling up   Complete by: As directed    Notify physician for vaginal bleeding   Complete by: As directed    PRETERM LABOR:  Includes any of the follwing symptoms that occur between 20 - [redacted] weeks gestation.  If these symptoms are not stopped, preterm labor can result in preterm  delivery, placing your baby at risk   Complete by: As directed       Allergies as of 01/30/2023       Reactions   Fluoxetine Other (See Comments)   Gave me a headache and I felt badly   Lavender Oil Hives   Shellfish-derived Products Hives, Itching, Other (See Comments)   Sweating, also (no breathing issues, however)   Nickel Rash        Medication List     STOP taking these medications    butalbital -acetaminophen -caffeine  50-325-40 MG tablet Commonly known as: FIORICET        TAKE these medications    acetaminophen  325 MG tablet Commonly known as:  TYLENOL  Take 650 mg by mouth every 6 (six) hours as needed for mild pain (pain score 1-3).   hydrOXYzine  25 MG tablet Commonly known as: ATARAX  Take 1 tablet (25 mg total) by mouth 3 (three) times daily as needed for anxiety.   prenatal multivitamin Tabs tablet Take 1 tablet by mouth daily at 12 noon.        Follow-up Information     Claudene Mort A, DO Follow up on 02/02/2023.   Specialty: Obstetrics and Gynecology Contact information: [address], [place] [address] [place] [address] [phone]                 Signed: JOLENE GEFFEL Leslie Guerrero 01/30/2023, 1:46 PM

## 2023-01-30 NOTE — Plan of Care (Signed)
  Problem: Education: Goal: Knowledge of disease or condition will improve Outcome: Adequate for Discharge Goal: Knowledge of the prescribed therapeutic regimen will improve Outcome: Adequate for Discharge Goal: Individualized Educational Video(s) Outcome: Adequate for Discharge   Problem: Clinical Measurements: Goal: Complications related to the disease process, condition or treatment will be avoided or minimized Outcome: Adequate for Discharge   Problem: Education: Goal: Knowledge of General Education information will improve Description: Including pain rating scale, medication(s)/side effects and non-pharmacologic comfort measures Outcome: Adequate for Discharge   Problem: Health Behavior/Discharge Planning: Goal: Ability to manage health-related needs will improve Outcome: Adequate for Discharge   Problem: Clinical Measurements: Goal: Ability to maintain clinical measurements within normal limits will improve Outcome: Adequate for Discharge Goal: Will remain free from infection Outcome: Adequate for Discharge Goal: Diagnostic test results will improve Outcome: Adequate for Discharge Goal: Respiratory complications will improve Outcome: Adequate for Discharge Goal: Cardiovascular complication will be avoided Outcome: Adequate for Discharge   Problem: Activity: Goal: Risk for activity intolerance will decrease Outcome: Adequate for Discharge   Problem: Nutrition: Goal: Adequate nutrition will be maintained Outcome: Adequate for Discharge   Problem: Coping: Goal: Level of anxiety will decrease Outcome: Adequate for Discharge   Problem: Elimination: Goal: Will not experience complications related to bowel motility Outcome: Adequate for Discharge Goal: Will not experience complications related to urinary retention Outcome: Adequate for Discharge   Problem: Pain Management: Goal: General experience of comfort will improve Outcome: Adequate for Discharge   Problem:  Safety: Goal: Ability to remain free from injury will improve Outcome: Adequate for Discharge   Problem: Skin Integrity: Goal: Risk for impaired skin integrity will decrease Outcome: Adequate for Discharge

## 2023-01-30 NOTE — Consult Note (Addendum)
 MFM Note  Leslie Guerrero is currently at 31 weeks and 4 days.  She has been hospitalized for the past 6 days due to recurrent vaginal bleeding and passage of blood clots.    She reports that she passed some brown-colored blood clots yesterday.  She denies any bright red vaginal bleeding.  Her fetal testing has been reassuring.  She reports feeling fetal movements throughout the day.    She has already received 2 (initial and rescue) courses of antenatal corticosteroids.  An ultrasound performed today showed an EFW of 3 pounds 8 ounces.  There was normal amniotic fluid noted.  The fetus is in the vertex presentation.    The edge of the anterior placenta measured over 3 cm away from the internal cervical os, indicating the placenta previa has resolved.    A possible blood clot continues to be noted in the area between the edge of the anterior placenta and the internal cervical os.    Due to this finding, the patient was advised that she may continue to pass brown blood clots for the next few days or weeks.  As she has been stable, has no complaints of abdominal cramping or bright red vaginal bleeding, and the fetal testing has been reassuring, she may be discharged home with close outpatient follow-up.  She should continue weekly fetal testing in your office until delivery.  She should have another growth ultrasound performed in 4 weeks.  Pelvic rest was advised.  She was advised to take it easy for the next couple of weeks.    Should she continue to pass brown blood clots but does not have bright red vaginal bleeding and the fetal testing is reassuring, delivery may be considered at around 37 weeks.  The patient was instructed to return to the hospital should she experience frequent contractions, lower abdominal cramping, or bright red bleeding.   Should she require readmission to the hospital due to bright red bleeding, delivery should be considered at any time at 34 weeks or greater.   Another course of steroids prior to delivery is not recommended.    The patient stated that she was comfortable and happy with this management plan.   She stated that all of her questions were answered.

## 2023-02-27 ENCOUNTER — Other Ambulatory Visit: Payer: Self-pay

## 2023-02-27 ENCOUNTER — Inpatient Hospital Stay (HOSPITAL_COMMUNITY): Payer: 59 | Admitting: Anesthesiology

## 2023-02-27 ENCOUNTER — Encounter (HOSPITAL_COMMUNITY)
Admission: AD | Disposition: A | Discharge status: Home / Self Care | Payer: Self-pay | Source: Home / Self Care | Attending: Obstetrics and Gynecology

## 2023-02-27 ENCOUNTER — Encounter (HOSPITAL_COMMUNITY): Payer: Self-pay | Admitting: Student

## 2023-02-27 ENCOUNTER — Inpatient Hospital Stay (HOSPITAL_COMMUNITY): Payer: 59

## 2023-02-27 ENCOUNTER — Inpatient Hospital Stay (HOSPITAL_COMMUNITY)
Admission: AD | Admit: 2023-02-27 | Discharge: 2023-03-03 | DRG: 787 | Disposition: A | Discharge status: Home / Self Care | Payer: 59 | Attending: Obstetrics and Gynecology | Admitting: Obstetrics and Gynecology

## 2023-02-27 DIAGNOSIS — Z6791 Unspecified blood type, Rh negative: Secondary | ICD-10-CM | POA: Diagnosis not present

## 2023-02-27 DIAGNOSIS — Z3A35 35 weeks gestation of pregnancy: Secondary | ICD-10-CM | POA: Diagnosis not present

## 2023-02-27 DIAGNOSIS — O4693 Antepartum hemorrhage, unspecified, third trimester: Secondary | ICD-10-CM | POA: Diagnosis not present

## 2023-02-27 DIAGNOSIS — E66813 Obesity, class 3: Secondary | ICD-10-CM | POA: Diagnosis present

## 2023-02-27 DIAGNOSIS — Z888 Allergy status to other drugs, medicaments and biological substances status: Secondary | ICD-10-CM

## 2023-02-27 DIAGNOSIS — O26893 Other specified pregnancy related conditions, third trimester: Secondary | ICD-10-CM | POA: Diagnosis present

## 2023-02-27 DIAGNOSIS — O99213 Obesity complicating pregnancy, third trimester: Secondary | ICD-10-CM

## 2023-02-27 DIAGNOSIS — D62 Acute posthemorrhagic anemia: Secondary | ICD-10-CM | POA: Diagnosis not present

## 2023-02-27 DIAGNOSIS — E669 Obesity, unspecified: Secondary | ICD-10-CM

## 2023-02-27 DIAGNOSIS — O4593 Premature separation of placenta, unspecified, third trimester: Principal | ICD-10-CM | POA: Diagnosis present

## 2023-02-27 DIAGNOSIS — O99214 Obesity complicating childbirth: Secondary | ICD-10-CM | POA: Diagnosis present

## 2023-02-27 DIAGNOSIS — O9081 Anemia of the puerperium: Secondary | ICD-10-CM | POA: Diagnosis not present

## 2023-02-27 DIAGNOSIS — O4703 False labor before 37 completed weeks of gestation, third trimester: Secondary | ICD-10-CM

## 2023-02-27 DIAGNOSIS — Z833 Family history of diabetes mellitus: Secondary | ICD-10-CM | POA: Diagnosis not present

## 2023-02-27 DIAGNOSIS — Z23 Encounter for immunization: Secondary | ICD-10-CM | POA: Diagnosis not present

## 2023-02-27 DIAGNOSIS — Z8249 Family history of ischemic heart disease and other diseases of the circulatory system: Secondary | ICD-10-CM

## 2023-02-27 DIAGNOSIS — O43199 Other malformation of placenta, unspecified trimester: Secondary | ICD-10-CM

## 2023-02-27 DIAGNOSIS — O4443 Low lying placenta NOS or without hemorrhage, third trimester: Secondary | ICD-10-CM | POA: Diagnosis not present

## 2023-02-27 LAB — URINALYSIS, ROUTINE W REFLEX MICROSCOPIC
Bacteria, UA: NONE SEEN
Bilirubin Urine: NEGATIVE
Glucose, UA: NEGATIVE mg/dL
Ketones, ur: NEGATIVE mg/dL
Leukocytes,Ua: NEGATIVE
Nitrite: NEGATIVE
Protein, ur: 30 mg/dL — AB
RBC / HPF: >50 RBC/hpf (ref 0–5)
Specific Gravity, Urine: 1.014 (ref 1.005–1.030)
pH: 6 (ref 5.0–8.0)

## 2023-02-27 LAB — CBC
HCT: 28.7 % — ABNORMAL LOW (ref 36.0–46.0)
Hemoglobin: 9.5 g/dL — ABNORMAL LOW (ref 12.0–15.0)
MCH: 28.7 pg (ref 26.0–34.0)
MCHC: 33.1 g/dL (ref 30.0–36.0)
MCV: 86.7 fL (ref 80.0–100.0)
Platelets: 408 10*3/uL — ABNORMAL HIGH (ref 150–400)
RBC: 3.31 MIL/uL — ABNORMAL LOW (ref 3.87–5.11)
RDW: 13.5 % (ref 11.5–15.5)
WBC: 14.1 10*3/uL — ABNORMAL HIGH (ref 4.0–10.5)
nRBC: 0 % (ref 0.0–0.2)

## 2023-02-27 LAB — WET PREP, GENITAL
Clue Cells Wet Prep HPF POC: NONE SEEN
Sperm: NONE SEEN
Trich, Wet Prep: NONE SEEN
WBC, Wet Prep HPF POC: <10 (ref <10)
Yeast Wet Prep HPF POC: NONE SEEN

## 2023-02-27 LAB — KLEIHAUER-BETKE STAIN
# Vials RhIg: 1
Fetal Cells %: 0 %
Quantitation Fetal Hemoglobin: 0 mL

## 2023-02-27 LAB — PROTIME-INR
INR: 1 (ref 0.8–1.2)
Prothrombin Time: 13.1 s (ref 11.4–15.2)

## 2023-02-27 LAB — APTT: aPTT: 25 s (ref 24–36)

## 2023-02-27 LAB — RH IG WORKUP (INCLUDES ABO/RH): Gestational Age(Wks): 35

## 2023-02-27 SURGERY — Surgical Case
Anesthesia: Spinal

## 2023-02-27 MED ORDER — PHENYLEPHRINE HCL (PRESSORS) 10 MG/ML IV SOLN
INTRAVENOUS | Status: DC | PRN
  Administered 2023-02-27: 160 ug via INTRAVENOUS
  Administered 2023-02-27: 80 ug via INTRAVENOUS

## 2023-02-27 MED ORDER — ACETAMINOPHEN 500 MG PO TABS
1000.0000 mg | ORAL_TABLET | Freq: Four times a day (QID) | ORAL | Status: DC
  Administered 2023-02-27 – 2023-03-03 (×16): 1000 mg via ORAL
  Filled 2023-02-27 (×15): qty 2

## 2023-02-27 MED ORDER — LACTATED RINGERS IV SOLN: INTRAVENOUS | Status: DC | PRN

## 2023-02-27 MED ORDER — STERILE WATER FOR IRRIGATION IR SOLN
Status: DC | PRN
  Administered 2023-02-27: 1

## 2023-02-27 MED ORDER — PRENATAL MULTIVITAMIN CH
1.0000 | ORAL_TABLET | Freq: Every day | ORAL | Status: DC
  Administered 2023-02-28 – 2023-03-03 (×4): 1 via ORAL
  Filled 2023-02-27 (×4): qty 1

## 2023-02-27 MED ORDER — HYDROMORPHONE HCL 1 MG/ML IJ SOLN: 0.2500 mg | INTRAMUSCULAR | Status: DC | PRN

## 2023-02-27 MED ORDER — SODIUM CHLORIDE 0.9 % IR SOLN
Status: DC | PRN
  Administered 2023-02-27: 1

## 2023-02-27 MED ORDER — BUPIVACAINE IN DEXTROSE 0.75-8.25 % IT SOLN
INTRATHECAL | Status: DC | PRN
  Administered 2023-02-27: 1.6 mL via INTRATHECAL

## 2023-02-27 MED ORDER — MORPHINE SULFATE (PF) 0.5 MG/ML IJ SOLN
INTRAMUSCULAR | Status: DC | PRN
  Administered 2023-02-27: 150 ug via INTRATHECAL

## 2023-02-27 MED ORDER — ONDANSETRON HCL 4 MG/2ML IJ SOLN
INTRAMUSCULAR | Status: DC | PRN
  Administered 2023-02-27: 4 mg via INTRAVENOUS

## 2023-02-27 MED ORDER — NALOXONE HCL 0.4 MG/ML IJ SOLN: 0.4000 mg | INTRAMUSCULAR | Status: DC | PRN

## 2023-02-27 MED ORDER — KETOROLAC TROMETHAMINE 30 MG/ML IJ SOLN
30.0000 mg | Freq: Four times a day (QID) | INTRAMUSCULAR | Status: AC
  Administered 2023-02-27 – 2023-02-28 (×4): 30 mg via INTRAVENOUS
  Filled 2023-02-27 (×4): qty 1

## 2023-02-27 MED ORDER — MEPERIDINE HCL 25 MG/ML IJ SOLN: 6.2500 mg | INTRAMUSCULAR | Status: DC | PRN

## 2023-02-27 MED ORDER — SODIUM CHLORIDE 0.9 % IV SOLN: 12.5000 mg | INTRAVENOUS | Status: DC | PRN

## 2023-02-27 MED ORDER — DIPHENHYDRAMINE HCL 25 MG PO CAPS
25.0000 mg | ORAL_CAPSULE | ORAL | Status: DC | PRN
  Administered 2023-02-28 (×2): 25 mg via ORAL
  Filled 2023-02-27 (×2): qty 1

## 2023-02-27 MED ORDER — COCONUT OIL OIL
1.0000 | TOPICAL_OIL | Status: DC | PRN
  Administered 2023-02-28: 1 via TOPICAL

## 2023-02-27 MED ORDER — PHENYLEPHRINE 80 MCG/ML (10ML) SYRINGE FOR IV PUSH (FOR BLOOD PRESSURE SUPPORT)
PREFILLED_SYRINGE | INTRAVENOUS | Status: AC
  Filled 2023-02-27: qty 10

## 2023-02-27 MED ORDER — SODIUM CHLORIDE 0.9 % IV SOLN
INTRAVENOUS | Status: AC
  Filled 2023-02-27: qty 5

## 2023-02-27 MED ORDER — ACETAMINOPHEN 10 MG/ML IV SOLN
INTRAVENOUS | Status: DC | PRN
  Administered 2023-02-27: 1000 mg via INTRAVENOUS

## 2023-02-27 MED ORDER — ACETAMINOPHEN 10 MG/ML IV SOLN
INTRAVENOUS | Status: AC
  Filled 2023-02-27: qty 100

## 2023-02-27 MED ORDER — IBUPROFEN 600 MG PO TABS
600.0000 mg | ORAL_TABLET | Freq: Four times a day (QID) | ORAL | Status: DC
  Administered 2023-02-28 – 2023-03-03 (×13): 600 mg via ORAL
  Filled 2023-02-27 (×12): qty 1

## 2023-02-27 MED ORDER — FENTANYL CITRATE (PF) 100 MCG/2ML IJ SOLN
INTRAMUSCULAR | Status: DC | PRN
  Administered 2023-02-27: 15 ug via INTRATHECAL

## 2023-02-27 MED ORDER — ZOLPIDEM TARTRATE 5 MG PO TABS: 5.0000 mg | ORAL_TABLET | Freq: Every evening | ORAL | Status: DC | PRN

## 2023-02-27 MED ORDER — SENNOSIDES-DOCUSATE SODIUM 8.6-50 MG PO TABS
2.0000 | ORAL_TABLET | Freq: Every day | ORAL | Status: DC
  Administered 2023-02-28 – 2023-03-03 (×4): 2 via ORAL
  Filled 2023-02-27 (×4): qty 2

## 2023-02-27 MED ORDER — ONDANSETRON HCL 4 MG/2ML IJ SOLN
INTRAMUSCULAR | Status: AC
  Filled 2023-02-27: qty 2

## 2023-02-27 MED ORDER — OXYTOCIN-SODIUM CHLORIDE 30-0.9 UT/500ML-% IV SOLN
INTRAVENOUS | Status: AC
  Filled 2023-02-27: qty 500

## 2023-02-27 MED ORDER — SIMETHICONE 80 MG PO CHEW
80.0000 mg | CHEWABLE_TABLET | Freq: Three times a day (TID) | ORAL | Status: DC
  Administered 2023-02-27 – 2023-03-03 (×11): 80 mg via ORAL
  Filled 2023-02-27 (×12): qty 1

## 2023-02-27 MED ORDER — PHENYLEPHRINE HCL-NACL 20-0.9 MG/250ML-% IV SOLN
INTRAVENOUS | Status: DC | PRN
  Administered 2023-02-27: 60 ug/min via INTRAVENOUS

## 2023-02-27 MED ORDER — TRANEXAMIC ACID-NACL 1000-0.7 MG/100ML-% IV SOLN
INTRAVENOUS | Status: DC | PRN
  Administered 2023-02-27: 1000 mg via INTRAVENOUS

## 2023-02-27 MED ORDER — OXYCODONE HCL 5 MG PO TABS
5.0000 mg | ORAL_TABLET | ORAL | Status: DC | PRN
  Administered 2023-02-28: 10 mg via ORAL
  Administered 2023-03-01 (×2): 5 mg via ORAL
  Administered 2023-03-01 (×2): 10 mg via ORAL
  Administered 2023-03-01: 5 mg via ORAL
  Administered 2023-03-02 (×3): 10 mg via ORAL
  Administered 2023-03-02: 5 mg via ORAL
  Administered 2023-03-03: 10 mg via ORAL
  Administered 2023-03-03: 5 mg via ORAL
  Filled 2023-02-27: qty 1
  Filled 2023-02-27 (×2): qty 2
  Filled 2023-02-27: qty 1
  Filled 2023-02-27 (×3): qty 2
  Filled 2023-02-27: qty 1
  Filled 2023-02-27: qty 2
  Filled 2023-02-27: qty 1
  Filled 2023-02-27: qty 2
  Filled 2023-02-27: qty 1

## 2023-02-27 MED ORDER — ENOXAPARIN SODIUM 60 MG/0.6ML IJ SOSY
50.0000 mg | PREFILLED_SYRINGE | INTRAMUSCULAR | Status: DC
  Administered 2023-02-28 – 2023-03-03 (×4): 50 mg via SUBCUTANEOUS
  Filled 2023-02-27 (×4): qty 0.6

## 2023-02-27 MED ORDER — KETOROLAC TROMETHAMINE 30 MG/ML IJ SOLN: 30.0000 mg | Freq: Once | INTRAMUSCULAR | Status: DC | PRN

## 2023-02-27 MED ORDER — FENTANYL CITRATE (PF) 100 MCG/2ML IJ SOLN
INTRAMUSCULAR | Status: AC
  Filled 2023-02-27: qty 2

## 2023-02-27 MED ORDER — SOD CITRATE-CITRIC ACID 500-334 MG/5ML PO SOLN
30.0000 mL | ORAL | Status: AC
  Administered 2023-02-27: 30 mL via ORAL
  Filled 2023-02-27: qty 30

## 2023-02-27 MED ORDER — HYDROXYZINE HCL 25 MG PO TABS: 25.0000 mg | ORAL_TABLET | Freq: Three times a day (TID) | ORAL | Status: DC | PRN

## 2023-02-27 MED ORDER — OXYTOCIN-SODIUM CHLORIDE 30-0.9 UT/500ML-% IV SOLN
INTRAVENOUS | Status: DC | PRN
  Administered 2023-02-27: 300 mL via INTRAVENOUS

## 2023-02-27 MED ORDER — ALBUMIN HUMAN 5 % IV SOLN: INTRAVENOUS | Status: DC | PRN

## 2023-02-27 MED ORDER — DIPHENHYDRAMINE HCL 50 MG/ML IJ SOLN: 12.5000 mg | INTRAMUSCULAR | Status: DC | PRN

## 2023-02-27 MED ORDER — DEXAMETHASONE SODIUM PHOSPHATE 10 MG/ML IJ SOLN
INTRAMUSCULAR | Status: AC
  Filled 2023-02-27: qty 1

## 2023-02-27 MED ORDER — NALOXONE HCL 4 MG/10ML IJ SOLN: 1.0000 ug/kg/h | INTRAVENOUS | Status: DC | PRN

## 2023-02-27 MED ORDER — ALBUMIN HUMAN 5 % IV SOLN
INTRAVENOUS | Status: AC
  Filled 2023-02-27: qty 250

## 2023-02-27 MED ORDER — CEFAZOLIN SODIUM-DEXTROSE 2-4 GM/100ML-% IV SOLN
2.0000 g | INTRAVENOUS | Status: AC
  Administered 2023-02-27: 2 g via INTRAVENOUS
  Filled 2023-02-27: qty 100

## 2023-02-27 MED ORDER — WITCH HAZEL-GLYCERIN EX PADS: 1.0000 | MEDICATED_PAD | CUTANEOUS | Status: DC | PRN

## 2023-02-27 MED ORDER — SODIUM CHLORIDE 0.9% FLUSH: 3.0000 mL | INTRAVENOUS | Status: DC | PRN

## 2023-02-27 MED ORDER — MENTHOL 3 MG MT LOZG: 1.0000 | LOZENGE | OROMUCOSAL | Status: DC | PRN

## 2023-02-27 MED ORDER — DIBUCAINE (PERIANAL) 1 % EX OINT: 1.0000 | TOPICAL_OINTMENT | CUTANEOUS | Status: DC | PRN

## 2023-02-27 MED ORDER — SODIUM CHLORIDE 0.9 % IV SOLN
500.0000 mg | INTRAVENOUS | Status: AC
  Administered 2023-02-27: 500 mg via INTRAVENOUS
  Filled 2023-02-27: qty 5

## 2023-02-27 MED ORDER — SIMETHICONE 80 MG PO CHEW: 80.0000 mg | CHEWABLE_TABLET | ORAL | Status: DC | PRN

## 2023-02-27 MED ORDER — MORPHINE SULFATE (PF) 0.5 MG/ML IJ SOLN
INTRAMUSCULAR | Status: AC
  Filled 2023-02-27: qty 10

## 2023-02-27 MED ORDER — OXYCODONE HCL 5 MG/5ML PO SOLN: 5.0000 mg | Freq: Once | ORAL | Status: DC | PRN

## 2023-02-27 MED ORDER — DEXAMETHASONE SODIUM PHOSPHATE 10 MG/ML IJ SOLN
INTRAMUSCULAR | Status: DC | PRN
  Administered 2023-02-27: 10 mg via INTRAVENOUS

## 2023-02-27 MED ORDER — OXYCODONE HCL 5 MG PO TABS: 5.0000 mg | ORAL_TABLET | Freq: Once | ORAL | Status: DC | PRN

## 2023-02-27 MED ORDER — OXYTOCIN-SODIUM CHLORIDE 30-0.9 UT/500ML-% IV SOLN
2.5000 [IU]/h | INTRAVENOUS | Status: AC
  Administered 2023-02-27: 2.5 [IU]/h via INTRAVENOUS
  Filled 2023-02-27: qty 500

## 2023-02-27 SURGICAL SUPPLY — 33 items
BENZOIN TINCTURE PRP APPL 2/3 (GAUZE/BANDAGES/DRESSINGS) IMPLANT
CHLORAPREP W/TINT 26 (MISCELLANEOUS) ×2 IMPLANT
CLAMP UMBILICAL CORD (MISCELLANEOUS) ×1 IMPLANT
CLOTH BEACON ORANGE TIMEOUT ST (SAFETY) ×1 IMPLANT
DERMABOND ADVANCED .7 DNX12 (GAUZE/BANDAGES/DRESSINGS) ×1 IMPLANT
DERMABOND ADVANCED .7 DNX6 (GAUZE/BANDAGES/DRESSINGS) IMPLANT
DRSG OPSITE POSTOP 4X10 (GAUZE/BANDAGES/DRESSINGS) ×1 IMPLANT
ELECT REM PT RETURN 9FT ADLT (ELECTROSURGICAL) ×1 IMPLANT
ELECTRODE REM PT RTRN 9FT ADLT (ELECTROSURGICAL) ×1 IMPLANT
EXTRACTOR VACUUM BELL STYLE (SUCTIONS) IMPLANT
GAUZE SPONGE 4X4 12PLY STRL LF (GAUZE/BANDAGES/DRESSINGS) IMPLANT
GLOVE BIOGEL PI IND STRL 6.5 (GLOVE) ×1 IMPLANT
GLOVE BIOGEL PI IND STRL 7.0 (GLOVE) ×2 IMPLANT
GLOVE SURG SS PI 6.0 STRL IVOR (GLOVE) ×1 IMPLANT
GOWN STRL REUS W/TWL LRG LVL3 (GOWN DISPOSABLE) ×2 IMPLANT
KIT ABG SYR 3ML LUER SLIP (SYRINGE) IMPLANT
NDL HYPO 25X5/8 SAFETYGLIDE (NEEDLE) IMPLANT
NEEDLE HYPO 25X5/8 SAFETYGLIDE (NEEDLE) IMPLANT
NS IRRIG 1000ML POUR BTL (IV SOLUTION) ×1 IMPLANT
PACK C SECTION WH (CUSTOM PROCEDURE TRAY) ×1 IMPLANT
PAD ABD 8X10 STRL (GAUZE/BANDAGES/DRESSINGS) IMPLANT
PAD OB MATERNITY 4.3X12.25 (PERSONAL CARE ITEMS) ×1 IMPLANT
RTRCTR C-SECT PINK 25CM LRG (MISCELLANEOUS) IMPLANT
SUT PDS AB 0 CTX 36 PDP370T (SUTURE) IMPLANT
SUT PLAIN ABS 2-0 CT1 27XMFL (SUTURE) IMPLANT
SUT VIC AB 0 CT1 36 (SUTURE) ×2 IMPLANT
SUT VIC AB 0 CTX36XBRD ANBCTRL (SUTURE) ×2 IMPLANT
SUT VIC AB 2-0 CT1 TAPERPNT 27 (SUTURE) ×2 IMPLANT
SUT VIC AB 4-0 KS 27 (SUTURE) ×1 IMPLANT
SUT VICRYL+ 3-0 36IN CT-1 (SUTURE) ×1 IMPLANT
TOWEL OR 17X24 6PK STRL BLUE (TOWEL DISPOSABLE) ×1 IMPLANT
TRAY FOLEY W/BAG SLVR 14FR LF (SET/KITS/TRAYS/PACK) ×1 IMPLANT
WATER STERILE IRR 1000ML POUR (IV SOLUTION) ×1 IMPLANT

## 2023-02-27 NOTE — Anesthesia Procedure Notes (Signed)
 Spinal  Patient location during procedure: OB Start time: 02/27/2023 2:15 PM End time: 02/27/2023 2:20 PM Reason for block: surgical anesthesia Staffing Performed: anesthesiologist  Anesthesiologist: Cleotilde Butler Dade, MD Performed by: Cleotilde Butler Dade, MD Authorized by: Cleotilde Butler Dade, MD   Preanesthetic Checklist Completed: patient identified, IV checked, risks and benefits discussed, surgical consent, monitors and equipment checked, pre-op evaluation and timeout performed Spinal Block Patient position: sitting Prep: DuraPrep and site prepped and draped Patient monitoring: heart rate, cardiac monitor, continuous pulse ox and blood pressure Approach: midline Location: L3-4 Injection technique: single-shot Needle Needle type: Pencan  Needle gauge: 24 G Needle length: 10 cm Assessment Sensory level: T4 Events: CSF return

## 2023-02-27 NOTE — Lactation Note (Signed)
 This note was copied from a baby's chart. Lactation Consultation Note  Patient Name: Leslie Guerrero Unijb'd Date: 02/27/2023 Age:35 hours Reason for consult: Initial assessment;Primapara;1st time breastfeeding;Late-preterm 34-36.6wks;Infant < 6lbs;Breastfeeding assistance  P1- Infant was born at [redacted]w[redacted]d weighing 2290g. Infant had been having issues with the suck, swallow, breathe rhythm when eating per nursery RN. After discussing this with the nursery RN, FOB and MOB we had came to an agreement on focusing on pumping until infant is reassessed in the morning. This will allow infant to acclimate to being out of the womb. LC set up MOB with a DEBP with size 18 mm flanges. MOB would benefit from using size 15/16 mm flanges when available to her. MOB pumped for 15 minutes and collected a few thick drops of colostrum. MOB has agreed to the use of both DBM and formula (as needed). Infant had already been bottle fed by nursery RN, so feeding was not needing at this time.  LPI/low birth weight feeding plan as follows: -Entire feeding may not exceed 30 minutes total. -Pump every 3 hrs and feed EBM to infant. -Supplementation for day 1 is 10-12 mL, day 2 is 15-18 mL and day 3 is 20-24 mL.  LC reviewed the first 24 hr birthday nap, feeding infant on cue 8-12x in 24 hrs, not allowing infant to go over 3 hrs without a feeding, CDC milk storage guidelines and LC services handout. LC encouraged MOB to call for further assistance as needed.  Maternal Data Has patient been taught Hand Expression?: Yes Does the patient have breastfeeding experience prior to this delivery?: No  Feeding Mother's Current Feeding Choice: Breast Milk and Donor Milk  Lactation Tools Discussed/Used Tools: Pump;Flanges Flange Size: 18 (would benefit from size 15mm flanges when available) Breast pump type: Double-Electric Breast Pump;Manual Pump Education: Setup, frequency, and cleaning;Milk Storage Reason for Pumping: LPI/low  birth weight feeding guidelines Pumping frequency: 15-20 min every 3 hrs  Interventions Interventions: Breast feeding basics reviewed;Expressed milk;Hand pump;DEBP;Education;Pace feeding;LC Services brochure;LPT handout/interventions  Discharge Discharge Education: Warning signs for feeding baby Pump: DEBP;Hands Free;Personal  Consult Status Consult Status: Follow-up Date: 02/28/23 Follow-up type: In-patient    Recardo Guild, IBCLC 02/27/2023, 9:16 PM

## 2023-02-27 NOTE — MAU Provider Note (Signed)
 History     CSN: [id number]  Arrival date and time: 02/27/23 1028   Event Date/Time   First Provider Initiated Contact with Patient 02/27/23 1215      Chief Complaint  Patient presents with   Back Pain   Vaginal Bleeding   HPI [name] is a 33 y.o. year old G48P0010 female at [redacted]w[redacted]d weeks gestation who presents to MAU reporting VB and lower back pain that radiates to both of her hips. She has had VB throughout her pregnancy. She once had a low-lying placenta that has since resolved. She reports the VB today was different in that it was moderate in amount. She also had palm sized blood clots; that is not abnormal. She is unsure of the source of the VB. She denies any recent SI; none since last November. She is unsure if she is leaking fluid. She reports (+) FM even after VB. She last ate @ 0730, but vomited it all up and last drank about 1230. She receives [facility] with [facility]. Her spouse is present and contributing to the history taking.   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   platelet count 531,000    Past Surgical History:  Procedure Laterality Date   DIAGNOSTIC LAPAROSCOPY WITH REMOVAL OF ECTOPIC PREGNANCY N/A 10/17/2020   Procedure: DIAGNOSTIC LAPAROSCOPY WITH REMOVAL OF ECTOPIC PREGNANCY;  Surgeon: Lavoie, Marie-Lyne, MD;  Location: [facility] OR;  Service: Gynecology;  Laterality: N/A;   Fatty mass removed     LAPAROSCOPIC UNILATERAL SALPINGECTOMY Right 10/17/2020   Procedure: LAPAROSCOPIC RIGHT SALPINGECTOMY, EVACUATION OF HEMOPERITONEUM;  Surgeon: Lavoie, Marie-Lyne, MD;  Location: [facility] OR;  Service: Gynecology;  Laterality: Right;   Nexplanon  insertion  04/25/2016    Family History   Problem Relation Age of Onset   Hypertension Mother    Diabetes Mother    Fibromyalgia Mother    Migraines Mother    Hypertension Father    Heart attack Father    Diabetes Paternal Uncle    Breast cancer Paternal Grandmother    Diabetes Paternal Grandfather    Stroke Paternal Grandfather     Social History   Tobacco Use   Smoking status: Never   Smokeless tobacco: Never  Vaping Use   Vaping status: Never Used  Substance Use Topics   Alcohol use: Not Currently    Alcohol/week: 1.0 standard drink of alcohol    Types: 1 Standard drinks or equivalent per week    Comment: socially   Drug use: No    Allergies:  Allergies  Allergen Reactions   Fluoxetine Other (See Comments)    Gave me a headache and I felt badly   Lavender Oil Hives   Shellfish-Derived Products Hives, Itching and Other (See Comments)    Sweating, also (no breathing issues, however)   Nickel Rash    Medications Prior to Admission  Medication Sig Dispense Refill Last Dose/Taking   hydrOXYzine  (ATARAX ) 25 MG tablet  Take 1 tablet (25 mg total) by mouth 3 (three) times daily as needed for anxiety. 60 tablet 1 Past Week   Prenatal Vit-Fe Fumarate-FA (PRENATAL MULTIVITAMIN) TABS tablet Take 1 tablet by mouth daily at 12 noon.   02/26/2023 Evening   acetaminophen  (TYLENOL ) 325 MG tablet Take 650 mg by mouth every 6 (six) hours as needed for mild pain (pain score 1-3).       Review of Systems  Constitutional: Negative.   HENT: Negative.    Eyes: Negative.   Respiratory: Negative.    Cardiovascular: Negative.   Gastrointestinal: Negative.   Endocrine: Negative.   Genitourinary:  Positive for pelvic pain (contractions) and vaginal bleeding (moderate to light with palm sized clots x 2 today).  Musculoskeletal:  Positive for back pain.  Skin: Negative.   Allergic/Immunologic: Negative.   Neurological: Negative.   Hematological: Negative.   Psychiatric/Behavioral: Negative.     Physical Exam   Blood  pressure 126/85, pulse (!) 117, temperature 98 F (36.7 C), temperature source Oral, resp. rate 18, height 5' 2 (1.575 m), weight 104.6 kg, last menstrual period 06/23/2022, SpO2 100%.  Physical Exam Vitals and nursing note reviewed. Exam conducted with a chaperone present.  Constitutional:      Appearance: Normal appearance. She is obese.  Cardiovascular:     Rate and Rhythm: Tachycardia present.  Pulmonary:     Effort: Pulmonary effort is normal.  Abdominal:     Palpations: Abdomen is soft.  Genitourinary:    General: Normal vulva.     Comments: Pelvic exam: External genitalia normal, SSE: vaginal walls pink and well rugated, cervix is smooth, pink, no lesions, moderate amt of watery BRB in vaginal vault -- fern slide collected prior, unable to visualize the cervical os due to the blood filling up the vaginal vault before able to clear it out with several large cotton-tipped swabs. Cervical deferred at this time.  Musculoskeletal:        General: Normal range of motion.  Skin:    General: Skin is warm and dry.  Neurological:     Mental Status: She is alert and oriented to person, place, and time.  Psychiatric:        Mood and Affect: Mood normal.        Behavior: Behavior normal.        Thought Content: Thought content normal.        Judgment: Judgment normal.    REACTIVE NST - FHR: 135 bpm / moderate variability / accels present / decels absent / TOCO: regular every 3-4 mins   MAU Course  Procedures  MDM CCUA Wet Prep  GC/CT -- Results pending CBC PT INR PTT ABO/Rh -- Results pending   Results for orders placed or performed during the hospital encounter of 02/27/23 (from the past 24 hours)  Wet prep, genital     Status: None   Collection Time: 02/27/23 11:04 AM  Result Value Ref Range   Yeast Wet Prep HPF POC NONE SEEN NONE SEEN   Trich, Wet Prep NONE SEEN NONE SEEN   Clue Cells Wet Prep HPF POC NONE SEEN NONE SEEN   WBC, Wet Prep HPF POC <10 <10   Sperm NONE  SEEN   Urinalysis, Routine w reflex microscopic -Urine, Clean Catch     Status: Abnormal   Collection Time: 02/27/23 11:05 AM  Result Value Ref Range   Color, Urine YELLOW YELLOW   APPearance CLEAR CLEAR   Specific Gravity, Urine 1.014 1.005 - 1.030  pH 6.0 5.0 - 8.0   Glucose, UA NEGATIVE NEGATIVE mg/dL   Hgb urine dipstick LARGE (A) NEGATIVE   Bilirubin Urine NEGATIVE NEGATIVE   Ketones, ur NEGATIVE NEGATIVE mg/dL   Protein, ur 30 (A) NEGATIVE mg/dL   Nitrite NEGATIVE NEGATIVE   Leukocytes,Ua NEGATIVE NEGATIVE   RBC / HPF >50 0 - 5 RBC/hpf   WBC, UA 0-5 0 - 5 WBC/hpf   Bacteria, UA NONE SEEN NONE SEEN   Squamous Epithelial / HPF 0-5 0 - 5 /HPF   Mucus PRESENT   CBC     Status: Abnormal   Collection Time: 02/27/23 11:35 AM  Result Value Ref Range   WBC 14.1 (H) 4.0 - 10.5 K/uL   RBC 3.31 (L) 3.87 - 5.11 MIL/uL   Hemoglobin 9.5 (L) 12.0 - 15.0 g/dL   HCT 71.2 (L) 63.9 - 53.9 %   MCV 86.7 80.0 - 100.0 fL   MCH 28.7 26.0 - 34.0 pg   MCHC 33.1 30.0 - 36.0 g/dL   RDW 86.4 88.4 - 84.4 %   Platelets 408 (H) 150 - 400 K/uL   nRBC 0.0 0.0 - 0.2 %  Protime-INR     Status: None   Collection Time: 02/27/23 11:35 AM  Result Value Ref Range   Prothrombin Time 13.1 11.4 - 15.2 seconds   INR 1.0 0.8 - 1.2  APTT     Status: None   Collection Time: 02/27/23 11:35 AM  Result Value Ref Range   aPTT 25 24 - 36 seconds  Type and screen [facility]     Status: None (Preliminary result)   Collection Time: 02/27/23 11:35 AM  Result Value Ref Range   ABO/RH(D) PENDING    Antibody Screen PENDING    Sample Expiration      03/02/2023,2359 Performed at [facility], [address], [place], [place] [address]     *Consult with Dr. Larraine Sharps @ 1224 - notified of patient's complaints, assessments, lab & NST results, tx plan bedside U/S in progress  Dr. Sharps will come see the patient with plans to more than likely to admit patient.  Dr. Sharps on the unit @  1248.  Assessment and Plan  1. Vaginal bleeding in pregnancy, third trimester (Primary)  2. Preterm uterine contractions in third trimester, antepartum  3. [redacted] weeks gestation of pregnancy - Care assumed by Dr. LOIS Sharps at [phone] - See Dr. Theressa documentation for H&P and orders  Ala Cart, CNM 02/27/2023, 12:25 PM

## 2023-02-27 NOTE — H&P (Signed)
 Leslie Guerrero is a 33 y.o. female G2P0010 @ 35.4 (EDD: 03/30/2023 by LMP cw 6 wk US ) with history of chronic bleeding in pregnancy who presented to MAU for increased bleeding. On arrival she was found to be pooling large amounts of watery blood and having regular contractions concerning for abruption. AFI on US  was 15.   Case was discussed with MFM, Dr. Ileana who agreed with delivery.   +Fms  Pregnancy is complicated by:  - Bleeding in pregnancy: has been hospitalized 3 times. Previously had low lying placenta but resolved. Source of bleeding is unknown. S/p BMZ 12/3-12/4 and 1/2-1/3.  - Rh negative: s/p Rhogam 12/3 - POTS: asymptomatic, no meds - Thrombocytosis  OB History     Gravida  2   Para      Term      Preterm      AB  1   Living         SAB      IAB      Ectopic  1   Multiple      Live Births             Past Medical History:  Diagnosis Date   Migraines    POTS (postural orthostatic tachycardia syndrome)    Thrombocytosis 05/2011   platelet count 531,000   Past Surgical History:  Procedure Laterality Date   DIAGNOSTIC LAPAROSCOPY WITH REMOVAL OF ECTOPIC PREGNANCY N/A 10/17/2020   Procedure: DIAGNOSTIC LAPAROSCOPY WITH REMOVAL OF ECTOPIC PREGNANCY;  Surgeon: Lavoie, Marie-Lyne, MD;  Location: [facility] OR;  Service: Gynecology;  Laterality: N/A;   Fatty mass removed     LAPAROSCOPIC UNILATERAL SALPINGECTOMY Right 10/17/2020   Procedure: LAPAROSCOPIC RIGHT SALPINGECTOMY, EVACUATION OF HEMOPERITONEUM;  Surgeon: Lavoie, Marie-Lyne, MD;  Location: [facility] OR;  Service: Gynecology;  Laterality: Right;   Nexplanon  insertion  04/25/2016   Family History: family history includes Breast cancer in her paternal grandmother; Diabetes in her mother, paternal grandfather, and paternal uncle; Fibromyalgia in her mother; Heart attack in her father; Hypertension in her father and mother; Migraines in her mother; Stroke in her paternal grandfather. Social History:  reports that  she has never smoked. She has never used smokeless tobacco. She reports that she does not currently use alcohol after a past usage of about 1.0 standard drink of alcohol per week. She reports that she does not use drugs.     Maternal Diabetes: No Genetic Screening: Normal Maternal Ultrasounds/Referrals: MFM Fetal Ultrasounds or other Referrals:  N/A Maternal Substance Abuse:  No Significant Maternal Medications:  None Significant Maternal Lab Results:  Group B Strep negative and Rh negative Number of Prenatal Visits:greater than 3 verified prenatal visits Maternal Vaccinations:RSV: Given during pregnancy >/=14 days ago and TDap Other Comments:  None  Review of Systems  Constitutional:  Negative for chills and fever.  Respiratory:  Negative for shortness of breath.   Cardiovascular:  Negative for chest pain.  Gastrointestinal:  Positive for abdominal pain, nausea and vomiting.  Genitourinary:  Positive for pelvic pain, vaginal bleeding and vaginal discharge.     Blood pressure 121/81, pulse (!) 107, temperature 98 F (36.7 C), temperature source Oral, resp. rate 18, height 5' 2 (1.575 m), weight 104.6 kg, last menstrual period 06/23/2022, SpO2 99%. Exam Physical Exam Constitutional:      General: She is not in acute distress.    Appearance: She is obese.  HENT:     Head: Normocephalic and atraumatic.  Pulmonary:     Effort: Pulmonary  effort is normal.  Abdominal:     Comments: Gravid, contractions palpated, soft between  Genitourinary:    Comments: SVE: large 100 cc clots evacuated, moderate pooling seen in posterior fornix. Cervix closed, clot within os Musculoskeletal:        General: Normal range of motion.  Skin:    General: Skin is warm and dry.  Neurological:     Mental Status: She is alert.  Psychiatric:        Mood and Affect: Mood normal.        Behavior: Behavior normal.     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  will proceed with delivery via for urgent primary cesarean section - Two IV sites present  - NPO - KB pending  - Reviewed R/B. Risks include but not limited to bleeding, infection, injury to bowel, bladder vasculature or nerves. Reviewed risk of hysterectomy in the case of refractory bleeding. Expresses understanding of all risks involved, questions answered, and she consents to the aforementioned procedure  [name] 02/27/2023, 1:52 PM

## 2023-02-27 NOTE — Transfer of Care (Signed)
 Immediate Anesthesia Transfer of Care Note  Patient: Leslie Guerrero  Procedure(s) Performed: CESAREAN SECTION  Patient Location: PACU  Anesthesia Type:Spinal  Level of Consciousness: awake, alert , and oriented  Airway & Oxygen Therapy: Patient Spontanous Breathing  Post-op Assessment: Report given to RN and Post -op Vital signs reviewed and stable  Post vital signs: Reviewed and stable  Last Vitals:  Vitals Value Taken Time  BP 98/53 02/27/23 1545  Temp    Pulse 119 02/27/23 1546  Resp 12 02/27/23 1546  SpO2 98 % 02/27/23 1546  Vitals shown include unfiled device data.  Last Pain:  Vitals:   02/27/23 1052  TempSrc: Oral  PainSc:          Complications: No notable events documented.

## 2023-02-27 NOTE — Anesthesia Postprocedure Evaluation (Signed)
 Anesthesia Post Note  Patient: Leslie Guerrero  Procedure(s) Performed: CESAREAN SECTION     Patient location during evaluation: PACU Anesthesia Type: Spinal Level of consciousness: awake and alert Pain management: pain level controlled Vital Signs Assessment: post-procedure vital signs reviewed and stable Respiratory status: spontaneous breathing, nonlabored ventilation and respiratory function stable Cardiovascular status: blood pressure returned to baseline and stable Postop Assessment: no apparent nausea or vomiting Anesthetic complications: no   No notable events documented.  Last Vitals:  Vitals:   02/27/23 1646 02/27/23 1656  BP: 100/65 (!) 101/54  Pulse: (!) 110 (!) 110  Resp: 16 16  Temp:  36.4 C  SpO2: 98% 95%    Last Pain:  Vitals:   02/27/23 1656  TempSrc: Axillary  PainSc: 0-No pain   Pain Goal:    LLE Motor Response: Purposeful movement (02/27/23 1645) LLE Sensation: Numbness, Tingling (02/27/23 1645) RLE Motor Response: Purposeful movement (02/27/23 1645) RLE Sensation: Numbness, Tingling (02/27/23 1645)     Epidural/Spinal Function Cutaneous sensation: Able to Wiggle Toes (02/27/23 1656), Patient able to flex knees: Yes (02/27/23 1656), Patient able to lift hips off bed: Yes (02/27/23 1656), Back pain beyond tenderness at insertion site: No (02/27/23 1656), Progressively worsening motor and/or sensory loss: No (02/27/23 1656), Bowel and/or bladder incontinence post epidural: No (02/27/23 1656)  Butler Levander Pinal

## 2023-02-27 NOTE — Consult Note (Signed)
 MFM Consult Note  Leslie Guerrero is currently at 35 weeks and 4 days.  She presented to the hospital today due to heavy vaginal bleeding with contractions.    She has been hospitalized multiple times over the past 2 months due to persistent vaginal bleeding/spotting.  The cause of her vaginal bleeding remains undetermined but is most likely due to a chronic abruption.  She has already received 2 courses (initial and rescue) of antenatal corticosteroids during her prior hospital admissions.    The patient reports that today the vaginal bleeding is much heavier than before.  In the MAU, blood was noted filling the speculum.  Her fetal heart rate tracing today is reactive.  Contractions every 3 to 4 minutes are noted.  A limited ultrasound performed today shows that the fetus is in the vertex presentation.  A normal-appearing anterior placenta is noted.  There were no signs of placenta previa.    The patient was advised that the cause of her vaginal bleeding could not be determined by today's ultrasound exam.  The patient had a growth scan performed in your office yesterday that showed an EFW of 5 pounds 14 ounces.    Due to chronic abruption with persistent, now heavy vaginal bleeding, delivery is recommended at her current gestational age.    The patient and her husband are excited and happy to proceed with delivery today.    They stated that all of their questions were answered.

## 2023-02-27 NOTE — Anesthesia Preprocedure Evaluation (Signed)
Anesthesia Evaluation  Patient identified by MRN, date of birth, ID band Patient awake    Reviewed: Allergy & Precautions, NPO status , Patient's Chart, lab work & pertinent test results  History of Anesthesia Complications Negative for: history of anesthetic complications  Airway Mallampati: III  TM Distance: >3 FB Neck ROM: Full    Dental  (+) Dental Advisory Given   Pulmonary neg pulmonary ROS   breath sounds clear to auscultation       Cardiovascular  Rhythm:Regular Rate:Normal  H/o POTS '19 ECHO normal   Neuro/Psych  Headaches    GI/Hepatic negative GI ROS, Neg liver ROS,,,  Endo/Other    Class 3 obesity  Renal/GU negative Renal ROS     Musculoskeletal   Abdominal  (+) + obese  Peds  Hematology   Anesthesia Other Findings   Reproductive/Obstetrics (+) Pregnancy (ruptured ectopic)                             Anesthesia Physical Anesthesia Plan  ASA: 2 and emergent  Anesthesia Plan: Spinal   Post-op Pain Management:    Induction: Intravenous  PONV Risk Score and Plan: 2 and Ondansetron, Dexamethasone, Scopolamine patch - Pre-op and Treatment may vary due to age or medical condition  Airway Management Planned: Natural Airway  Additional Equipment: None  Intra-op Plan:   Post-operative Plan:   Informed Consent: I have reviewed the patients History and Physical, chart, labs and discussed the procedure including the risks, benefits and alternatives for the proposed anesthesia with the patient or authorized representative who has indicated his/her understanding and acceptance.     Dental advisory given  Plan Discussed with: CRNA and Surgeon  Anesthesia Plan Comments:         Anesthesia Quick Evaluation

## 2023-02-27 NOTE — Progress Notes (Signed)
[facility] consult. Arrived to room. Currently patient unavailable d/t unit staff at [facility] Requested that [facility] return latter. VU. Tomasita Morrow, RN [facility]

## 2023-02-27 NOTE — Op Note (Signed)
 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 Von, MD    An experienced assistant was required given the standard of surgical care given the complexity of the case and maternal body habitus.  This assistant was needed for exposure, dissection, suctioning, retraction, instrument exchange, assisting with delivery with administration of fundal pressure, and for overall help during the procedure.      Operative Findings: Gravid uterus. Thin lower uterine segment. Placenta actively abrupting upon hysterotomy. Clear amniotic fluid. Viable female infant in cephalic position weighing 2290 with APGARS of 8 and 9 at 1 and 5 minutes, respectively.   Specimens: Placenta to pathology EBL 413 IVF 2250 UOP 200  Patient course: 33 yo G2P0010 at 35 weeks with history of chronic bleeding in pregnancy of unknown etiology presented with active bleeding and contractions suspicious for placental abruption.   Consent:  R/B/A of cesarean section discussed with patient. Alternative would be vaginal delivery which would mean shorter postpartum stay and decreased risk of bleeding. Risks of cesarean section include but are not limited to infection of the uterus, pelvic organs, or skin, inadvertent injury to internal organs, such as bowel or bladder, vasculature, and nerves. If there is major injury, extensive surgery may be required. If injury is minor, it may be treated with relative ease and repaired at the time of injury. Discussed possibility of excessive blood loss and transfusion. If bleeding cannot be controlled using medical or minor surgical methods, a cesarean hysterectomy  may be performed which would mean no future fertility. Patient accepts the possibility of blood transfusion, if necessary. Patient understands and agrees to move forward with section.    Operative Procedure: Patient was taken to the operating room where spinal  anesthesia was found to be adequate by Allis clamp test. She was prepped and draped in the normal sterile fashion in the dorsal supine position with a leftward tilt. An appropriate time out was performed. A Pfannenstiel skin incision was then made with the scalpel and carried through to the underlying layer of fascia by sharp dissection and Bovie cautery. The fascia was nicked in the midline and the incision was extended laterally with Mayo scissors. The superior aspect of the incision was grasped Coker clamps and dissected off the underlying rectus muscles. In a similar fashion the inferior aspect was dissected off the rectus muscles. Rectus muscles were separated in the midline and the peritoneal cavity entered bluntly. The peritoneal incision was then extended both superiorly and inferiorly with careful attention to avoid both bowel and bladder. The Alexis self-retaining wound retractor was then placed within the incision and the lower uterine segment exposed. The bladder flap was developed with Metzenbaum scissors and pushed away from the lower uterine segment. The lower uterine segment was then incised in a low transverse fashion and the cavity itself entered bluntly. Upon entry, the placenta was noted to be actively abrupting. The incision was extended bluntly. Amniotic sac was ruptured and fluid was noted to be clear in color. The infant's head was then lifted and delivered from the incision without difficulty using the standard movements. The remainder of the infant delivered with difficulty. The cord was clamped and cut after 1 minute. The infant was handed  off to the waiting neonatal staff. The placenta was then manually removed. An accessory  lobe superior to the cervical os was noted with clot present.   The uterus cleared of all clots and debris with moist lap sponge. The uterine incision was then repaired in 2 layers the first layer was a running locked layer of 0-vicryl and the second an imbricating layer of the same suture. The uterine incision was inspected and found to be hemostatic. All instruments and sponges as well as the Alexis retractor were then removed from the abdomen. The rectus muscles were then reapproximated with several interrupted mattress sutures of 2-0 Vicryl. The fascia was then closed with 0 Vicryl in a running fashion. Subcutaneous tissue was reapproximated with 3-0 plain in a running fashion. The skin was closed with a subcuticular stitch of 4-0 Vicryl on a Keith needle and then reinforced with Dermabond and a Honeycomb dressing. At the conclusion of the procedure, the fundus was firm and no additional bleeding was expressed from the vagina. All instruments and sponge counts were correct. Patient was taken to the recovery room in good condition with her baby accompanying her skin to skin.

## 2023-02-27 NOTE — MAU Note (Signed)
 Leslie Guerrero is a 33 y.o. at [redacted]w[redacted]d here in MAU reporting: she has VB and lower back pain that radiates into bilateral hips.  States has had VB throughout the pregnancy, states VB today was moderate but light now.  States she passed two palm sized clots, states that's not abnormal.  Reports source of VB is unknown  Denies recent intercourse, states no intercourse since November.  Reports unsure if any Leaking of fluid, just noted bleeding.  Endorses +FM since VB incident.   LMP: NA Onset of complaint: today Pain score: 7 Vitals:   02/27/23 1052  BP: 123/74  Pulse: (!) 120  Resp: 18  Temp: 98 F (36.7 C)  SpO2: 100%     FHT: 132 bpm  Lab orders placed from triage: UA

## 2023-02-28 ENCOUNTER — Encounter (HOSPITAL_COMMUNITY): Payer: Self-pay | Admitting: Student

## 2023-02-28 LAB — CBC
HCT: 25.4 % — ABNORMAL LOW (ref 36.0–46.0)
Hemoglobin: 8.4 g/dL — ABNORMAL LOW (ref 12.0–15.0)
MCH: 29 pg (ref 26.0–34.0)
MCHC: 33.1 g/dL (ref 30.0–36.0)
MCV: 87.6 fL (ref 80.0–100.0)
Platelets: 371 10*3/uL (ref 150–400)
RBC: 2.9 MIL/uL — ABNORMAL LOW (ref 3.87–5.11)
RDW: 13.7 % (ref 11.5–15.5)
WBC: 19.4 10*3/uL — ABNORMAL HIGH (ref 4.0–10.5)
nRBC: 0 % (ref 0.0–0.2)

## 2023-02-28 LAB — GC/CHLAMYDIA PROBE AMP (~~LOC~~) NOT AT ARMC
Chlamydia: NEGATIVE
Comment: NEGATIVE
Comment: NORMAL
Neisseria Gonorrhea: NEGATIVE

## 2023-02-28 LAB — RPR: RPR Ser Ql: NONREACTIVE

## 2023-02-28 MED ORDER — RHO D IMMUNE GLOBULIN 1500 UNIT/2ML IJ SOSY
300.0000 ug | PREFILLED_SYRINGE | Freq: Once | INTRAMUSCULAR | Status: DC
  Filled 2023-02-28: qty 2

## 2023-02-28 MED ORDER — RHO D IMMUNE GLOBULIN 1500 UNIT/2ML IJ SOSY
300.0000 ug | PREFILLED_SYRINGE | Freq: Once | INTRAMUSCULAR | Status: AC
  Administered 2023-02-28: 300 ug via INTRAVENOUS
  Filled 2023-02-28: qty 2

## 2023-02-28 NOTE — Lactation Note (Addendum)
 This note was copied from a baby's chart. Lactation Consultation Note  Patient Name: Leslie Guerrero Unijb'd Date: 02/28/2023 Age:35 hours Reason for consult: Follow-up assessment;Late-preterm 34-36.6wks  P1, 35 wks, @ 22 hrs of life. Mom shares she's been pumping every 3 hrs, only seen drops. Reassured mom that is exactly as expected. Breast pump cycles are important for breast stimulation and bringing in milk supply- even if only small amounts seen. Encouraged mom that hand expression could be better way to collect EBM. Demonstrated starting a pump cycle, using coconut oil if desired, and flange sizes discussed - flange kit to look for. LC OP services and milk storage shared with mom. Encouraged mom to continue pumping and hand expression attempts, call as assist desired. Baby has not yet come to breast, currently baby asleep and mom eating lunch with company.   Feeding Mother's Current Feeding Choice: Breast Milk and Donor Milk Nipple Type: Slow - flow   Lactation Tools Discussed/Used Flange Size: 18 Breast pump type: Double-Electric Breast Pump Pump Education: Setup, frequency, and cleaning;Milk Storage Reason for Pumping: [redacted] wks gestation, breast stimulation  Interventions Interventions: Hand express;Coconut oil;Hand pump;DEBP;Education;LC Services brochure;Expressed milk (Milk Storage Guidelines)  Discharge Pump: Hands Free;Personal;Manual;DEBP (Per mom has a mom cozy wearable, Medela DEBP, and LC demonstrated pump kit parts make a hand pump. Discussed wearables may not empty as well as DEBP- if engorgement builds up- revert to Medela. Hand pump may be best option if engorged to move milk.)  Consult Status Consult Status: Follow-up Date: 03/01/23 Follow-up type: In-patient    [name] 02/28/2023, 1:45 PM

## 2023-02-28 NOTE — Progress Notes (Addendum)
 Subjective: Postpartum/Post-Op Day 1: Cesarean Delivery Patient reports tolerating PO, + flatus, and no problems voiding.  Pain well controlled. Ambulating well. Lochia minimal.   Objective: Vital signs in last 24 hours: Temp:  [97.5 F (36.4 C)-98.6 F (37 C)] 98.3 F (36.8 C) (02/05 0430) Pulse Rate:  [88-147] 88 (02/05 0430) Resp:  [12-23] 18 (02/05 0430) BP: (93-126)/(53-85) 105/68 (02/05 0430) SpO2:  [95 %-100 %] 100 % (02/05 0430)  Physical Exam:  General: alert, cooperative, and no distress Lochia: appropriate Uterine Fundus: firm Incision: honeycomb dressing in place, no strikethrough DVT Evaluation: No evidence of DVT seen on physical exam.  Recent Labs    02/27/23 1135 02/28/23 0547  HGB 9.5* 8.4*  HCT 28.7* 25.4*    Assessment/Plan: Leslie Guerrero is a 33 y.o. female G2 now P1011 s/p pLTCS @ 35.4 for heavy bleeding in the setting of chronic placental abruption. Status post Cesarean section. Doing well postoperatively.  Continue current care. Lovenox  and SCDs for DVT ppx. 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 Hgb 9.5-8.4 post op. Recommend starting oral iron every other day after discharge. -POTS: no meds -Rh neg: baby Rh pos. Rhogam ordered. Rubie DELENA Husky, MD 02/28/2023, 11:13 AM

## 2023-03-01 LAB — SURGICAL PATHOLOGY

## 2023-03-01 LAB — RH IG WORKUP (INCLUDES ABO/RH)
Fetal Screen: NEGATIVE
Gestational Age(Wks): 35.4
Unit division: 0

## 2023-03-01 NOTE — Progress Notes (Signed)
 Subjective: Postpartum/Post-Op Day 2: Cesarean Delivery Patient reports tolerating PO, + flatus, and no problems voiding.  Pain well controlled. Ambulating well. Lochia minimal.   Objective: Vital signs in last 24 hours: Temp:  [98.1 F (36.7 C)-98.4 F (36.9 C)] 98.1 F (36.7 C) (02/06 0609) Pulse Rate:  [87-95] 89 (02/06 0609) Resp:  [18] 18 (02/06 0609) BP: (104-115)/(68-76) 104/68 (02/06 0609) SpO2:  [100 %] 100 % (02/06 0609)  Physical Exam:  General: alert, cooperative, and no distress Lochia: appropriate Uterine Fundus: firm Incision: honeycomb dressing in place, incision clean/dry, intact DVT Evaluation: No evidence of DVT seen on physical exam.  Recent Labs    02/27/23 1135 02/28/23 0547  HGB 9.5* 8.4*  HCT 28.7* 25.4*    Assessment/Plan: Leslie Guerrero is a 33 y.o. female G2 now P1011 POD2 s/p pLTCS @ 35.4 for heavy bleeding in the setting of chronic placental abruption. Status post Cesarean section. Doing well postoperatively.  Continue current care. Lovenox  and SCDs for DVT ppx. 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 Hgb 9.5-8.4 post op. Recommend starting oral iron every other day after discharge. -POTS: no meds -Rh neg: baby Rh pos. Rhogam given - Dispo: anticipate discharge tomorrow  Leslie FORBES Chapel, MD 03/01/2023, 9:28 AM

## 2023-03-01 NOTE — Progress Notes (Signed)
 Pt called out reporting dizziness and feeling overheated while in the shower. Upon entering the room, pt was sitting on the toilet. Pt was pale and also reporting that her breathing feels weird. Small clot noted in shower drain, scant bleeding noted in toilet. Pt assisted back to bed with the stedy. Encouraged pt to eat soon and increase oral fluid intake. Vital signs WDL.

## 2023-03-01 NOTE — Lactation Note (Signed)
 This note was copied from a baby's chart. Lactation Consultation Note  Patient Name: Girl Zaya Kessenich Unijb'd Date: 03/01/2023 Age:35 hours  P1, 35 wks, @ 47 hrs of life. Mom pumping and feeding, infant mastering nipple/bottle feeding- still a challenge- has not been to breast yet. Encouraged mom to pump every 3 hrs. Mom is seeing more colostrum with pumping, praised mom. Discussed milk coming in expectations. Discussed best distractions/relaxed during pumping to release milk. Encourage to call as desired/ with questions.   Feeding Mother's Current Feeding Choice: Breast Milk and Formula Nipple Type: Nfant Slow Flow (purple)  Interventions Interventions: Education  Discharge    Consult Status Consult Status: Follow-up Date: 03/02/23 Follow-up type: In-patient    [facility] 03/01/2023, 2:09 PM

## 2023-03-02 MED ORDER — FERROUS SULFATE 325 (65 FE) MG PO TABS
325.0000 mg | ORAL_TABLET | ORAL | Status: DC
  Administered 2023-03-02: 325 mg via ORAL
  Filled 2023-03-02: qty 1

## 2023-03-02 NOTE — Lactation Note (Signed)
 This note was copied from a baby's chart. Lactation Consultation Note  Patient Name: Girl Keron Koffman Unijb'd Date: 03/02/2023 Age:35 hours Reason for consult: 1st time breastfeeding;Follow-up assessment;Late-preterm 34-36.6wks;Infant < 6lbs (weight loss -8.74%) MOB latched infant for 4 minutes on her left breast using football hold, infant sustained latch but became tired, afterwards infant was given 30 mls EBM by FOB using gold Nfant nipple while MOB used the DEBP. again, pumped prior to latch due to breast fullness.   Current feeding plan: 1- MOB will continue to follow LPTI feeding guidelines, with limit total feedings to 30 minutes or less ( breast and bottle feeding infant) afterwards supplement infant on Day 4 with  28-30 mls per feeding. 2- MOB knows to continue to ask for latch assistance if needed. 3- MOB will continue to use DEBP every 3 hours for 15 minutes or sooner if feel breast fullness or discomfort.  Maternal Data    Feeding Mother's Current Feeding Choice: Breast Milk and Donor Milk Nipple Type: Nfant Extra Slow Flow (gold)  LATCH Score Latch: Grasps breast easily, tongue down, lips flanged, rhythmical sucking.  Audible Swallowing: A few with stimulation  Type of Nipple: Everted at rest and after stimulation  Comfort (Breast/Nipple): Soft / non-tender  Hold (Positioning): Full assist, staff holds infant at breast  LATCH Score: 7   Lactation Tools Discussed/Used Tools: Pump Flange Size: 18 Breast pump type: Double-Electric Breast Pump Reason for Pumping: LPTI Pumping frequency: MOB will continue to pump every 3 hours for 15 minutes on inital setting. Pumped volume: 30 mL (still pumping when LC left room due to breast fullness)  Interventions Interventions: Assisted with latch;Skin to skin;Breast compression;Adjust position;Position options;Support pillows;DEBP;Education;Pace feeding;LPT handout/interventions  Discharge    Consult Status Consult  Status: Follow-up Date: 03/03/23 Follow-up type: In-patient    Grayce LULLA Batter 03/02/2023, 5:38 PM

## 2023-03-02 NOTE — Progress Notes (Signed)
 Subjective: Postpartum Day 3: Cesarean Delivery Doing well. Tolerating PO. Voiding. +flatus, +BM. Bleeding light. Baby not being discharged today due to weight loss. Mild dizziness with ambulation  Objective: Vital signs in last 24 hours: Temp:  [98.8 F (37.1 C)] 98.8 F (37.1 C) (02/07 0531) Pulse Rate:  [94] 94 (02/07 0531) Resp:  [18] 18 (02/07 0531) BP: (106)/(69) 106/69 (02/07 0531) SpO2:  [98 %] 98 % (02/07 0531)  Physical Exam:  General: alert, cooperative, and morbidly obese Lochia: appropriate Uterine Fundus: firm Incision: Honeycomb in place, no strikethrough DVT Evaluation: No evidence of DVT seen on physical exam. No significant calf/ankle edema.  Recent Labs    02/28/23 0547  HGB 8.4*  HCT 25.4*    Assessment/Plan: Leslie Guerrero is a 33 y.o. female G2 now P1011 POD3 s/p pLTCS @ 35.4 for heavy bleeding in the setting of chronic placental abruption. -Status post Cesarean section. Doing well postoperatively.  -Continue current care. Lovenox  and SCDs for DVT ppx. 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 Hgb 9.5-8.4 post op. Start oral iron now as she is feeling symptomatic. Declines IV iron -POTS: no meds -Rh neg: baby Rh pos. Rhogam given - Dispo: anticipate discharge tomorrow given pediatric hold on DC  [name], DO 03/02/2023, 2:20 PM

## 2023-03-03 LAB — TYPE AND SCREEN
ABO/RH(D): AB NEG
Antibody Screen: POSITIVE
Unit division: 0
Unit division: 0

## 2023-03-03 LAB — BPAM RBC
Blood Product Expiration Date: 202502202359
Blood Product Expiration Date: 202502202359
Unit Type and Rh: 600
Unit Type and Rh: 600

## 2023-03-03 MED ORDER — OXYCODONE HCL 5 MG PO TABS: 5.0000 mg | ORAL_TABLET | Freq: Four times a day (QID) | ORAL | 0 refills | Status: AC | PRN

## 2023-03-03 MED ORDER — IBUPROFEN 600 MG PO TABS: 600.0000 mg | ORAL_TABLET | Freq: Four times a day (QID) | ORAL | 0 refills | Status: AC

## 2023-03-03 NOTE — Discharge Summary (Signed)
 Postpartum Discharge Summary  Date of Service updated 03/03/23     Patient Name: Leslie Guerrero DOB: Jul 05, 1990 MRN: [id number]  Date of admission: 02/27/2023 Delivery date:02/27/2023 Delivering provider: CLAUDENE MORT A Date of discharge: 03/03/2023  Admitting diagnosis: Vaginal bleeding in pregnancy, third trimester [O46.93] Intrauterine pregnancy: [redacted]w[redacted]d     Secondary diagnosis:  Principal Problem:   Vaginal bleeding in pregnancy, third trimester  Additional problems: Placental abruption, Preterm delivery    Discharge diagnosis: Preterm Pregnancy Delivered                                              Post partum procedures:rhogam Augmentation: N/A Complications: Placental Abruption  Hospital course: Sceduled C/S   33 y.o. yo G2P0111 at [redacted]w[redacted]d was admitted to the hospital 02/27/2023 for scheduled cesarean section with the following indication: Recurrent vaginal bleeding in the setting of chronic placental aburption .Delivery details are as follows:  Membrane Rupture Time/Date:  ,   Delivery Method:C-Section, Low Transverse Operative Delivery:N/A Details of operation can be found in separate operative note.  Patient had a postpartum course complicated by anemia for which she declined IV iron.  Plan PO iron at discharge.  She is ambulating, tolerating a regular diet, passing flatus, and urinating well. Patient is discharged home in stable condition on  03/03/23        Newborn Data: Birth date:02/27/2023 Birth time:2:50 PM Gender:Female Living status:Living Apgars:8 ,9  Weight:2290 g    Magnesium Sulfate received: No BMZ received: Yes in December and january Rhophylac :Yes MMR:N/A T-DaP:Given prenatally Flu: No RSV Vaccine received: Yes Transfusion:No Immunizations administered: Immunization History  Administered Date(s) Administered   HPV Quadrivalent 03/29/2011, 06/13/2011   Tdap 09/16/2017    Physical exam  Vitals:   03/02/23 0531 03/02/23 1509 03/02/23 2300  03/03/23 0544  BP: 106/69 116/77 101/68 122/78  Pulse: 94 100 85   Resp: 18 18 18 16   Temp: 98.8 F (37.1 C) 98.2 F (36.8 C) 98.2 F (36.8 C) 98.3 F (36.8 C)  TempSrc: Oral Oral Oral Oral  SpO2: 98% 100% 99% 100%  Weight:      Height:       General: alert, cooperative, and no distress Lochia: appropriate Uterine Fundus: firm Incision: Dressing is clean, dry, and intact DVT Evaluation: No evidence of DVT seen on physical exam. Labs: Lab Results  Component Value Date   WBC 19.4 (H) 02/28/2023   HGB 8.4 (L) 02/28/2023   HCT 25.4 (L) 02/28/2023   MCV 87.6 02/28/2023   PLT 371 02/28/2023      Latest Ref Rng & Units 12/26/2022    6:07 PM  CMP  Glucose 70 - 99 mg/dL 897   BUN 6 - 20 mg/dL 9   Creatinine 9.55 - 8.99 mg/dL 9.51   Sodium 864 - 854 mmol/L 135   Potassium 3.5 - 5.1 mmol/L 3.6   Chloride 98 - 111 mmol/L 103   CO2 22 - 32 mmol/L 21   Calcium  8.9 - 10.3 mg/dL 8.6   Total Protein 6.5 - 8.1 g/dL 6.7   Total Bilirubin <8.7 mg/dL 0.4   Alkaline Phos 38 - 126 U/L 92   AST 15 - 41 U/L 20   ALT 0 - 44 U/L 17    Edinburgh Score:    02/28/2023    5:55 AM  Edinburgh Postnatal Depression Scale Screening Tool  I have been able to laugh and see the funny side of things. 0  I have looked forward with enjoyment to things. 2  I have blamed myself unnecessarily when things went wrong. 1  I have been anxious or worried for no good reason. 0  I have felt scared or panicky for no good reason. 0  Things have been getting on top of me. 0  I have been so unhappy that I have had difficulty sleeping. 0  I have felt sad or miserable. 0  I have been so unhappy that I have been crying. 0  The thought of harming myself has occurred to me. 0  Edinburgh Postnatal Depression Scale Total 3      After visit meds:  Allergies as of 03/03/2023       Reactions   Fluoxetine Other (See Comments)   Gave me a headache and I felt badly   Lavender Oil Hives   Shellfish-derived Products  Hives, Itching, Other (See Comments)   Sweating, also (no breathing issues, however)   Nickel Rash        Medication List     STOP taking these medications    hydrOXYzine  25 MG tablet Commonly known as: ATARAX        TAKE these medications    acetaminophen  325 MG tablet Commonly known as: TYLENOL  Take 650 mg by mouth every 6 (six) hours as needed for mild pain (pain score 1-3).   ibuprofen  600 MG tablet Commonly known as: ADVIL  Take 1 tablet (600 mg total) by mouth every 6 (six) hours.   oxyCODONE  5 MG immediate release tablet Commonly known as: Oxy IR/ROXICODONE  Take 1-2 tablets (5-10 mg total) by mouth every 6 (six) hours as needed for severe pain (pain score 7-10).   prenatal multivitamin Tabs tablet Take 1 tablet by mouth daily at 12 noon.         Discharge home in stable condition Infant Feeding: Breast Infant Disposition:home with mother Discharge instruction: per After Visit Summary and Postpartum booklet. Activity: Advance as tolerated. Pelvic rest for 6 weeks.  Diet: routine diet Anticipated Birth Control: Unsure Postpartum Appointment:6 weeks Additional Postpartum F/U: Incision check 2 weeks Future Appointments:No future appointments. Follow up Visit:  Follow-up Information     Claudene Mort A, DO Follow up in 2 week(s).   Specialty: Obstetrics and Gynecology Contact information: [address] [place] [address] [place] [address] [phone]                     03/03/2023 [facility] VELNA GASKINS, MD

## 2023-03-03 NOTE — Progress Notes (Signed)
 Subjective: Postpartum Day 4: Cesarean Delivery Doing well. Tolerating PO. Voiding. +flatus, +BM. Bleeding light. Ambulating without difficulty  Objective: Vital signs in last 24 hours: Temp:  [98.2 F (36.8 C)-98.3 F (36.8 C)] 98.3 F (36.8 C) (02/08 0544) Pulse Rate:  [85-100] 85 (02/07 2300) Resp:  [16-18] 16 (02/08 0544) BP: (101-122)/(68-78) 122/78 (02/08 0544) SpO2:  [99 %-100 %] 100 % (02/08 0544)  Physical Exam:  General: alert, cooperative, and morbidly obese Lochia: appropriate Uterine Fundus: firm Incision: Honeycomb in place, no strikethrough DVT Evaluation: No evidence of DVT seen on physical exam. No significant calf/ankle edema.    Assessment/Plan: Leslie Guerrero is a 33 y.o. female G2 now P1011 POD#4 s/p pLTCS @ 35.4 for heavy bleeding in the setting of chronic placental abruption. -Status post Cesarean section. Doing well postoperatively.  -Continue current care. Lovenox  and SCDs for DVT ppx. 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 Hgb 9.5-8.4 post op. Start oral iron now as she is feeling symptomatic. Declines IV iron -POTS: no meds -Rh neg: baby Rh pos. Rhogam given - Meeting all goals.  Discharge to home today.   [facility] VELNA GASKINS, MD 03/03/2023, 8:59 AM

## 2023-03-03 NOTE — Lactation Note (Signed)
 This note was copied from a baby's chart. Lactation Consultation Note  Patient Name: Leslie Guerrero Date: 03/03/2023 Age:35 days Reason for consult: Follow-up assessment;Primapara;1st time breastfeeding;Exclusive pumping and bottle feeding;Late-preterm 34-36.6wks;Infant < 6lbs;Infant weight loss;Engorgement  P1- Infant was born at [redacted]w[redacted]d weighing 2290g and had a weight loss of 9.63% on day 4 of life. MOB reports that she has been following the LPI/low birth weight feeding plan that this LC set in place on day 1 of life. MOB also reports that she has been pumping every 3 hrs and her milk has came in. The RN opened MOB's fridge to show LC of MOB's many bottles of EBM that has been collected so far. MOB is now fortifying her milk to ensure proper nutrition of infant. LC praised MOB for her hard work and determination. At this time, the feeding plan is to: focus on exclusive pumping until infant is a little older and more sufficient with eating. Then MOB will call [facility] team when she is ready to see Reena Forster to work on latching again when her and infant are ready. At this time MOB will not be latching infant until they are both ready for her to start latching again. LC provided MOB with ice packs and a milk storage cooler to transport her EBM. LC reviewed feeding infant on cue 8-12x in 24 hrs, not allowing infant to go over 3 hrs without a feeding, CDC milk storage guidelines, LC services handout and engorgement/breast care. LC encouraged MOB to call for further assistance as needed.  Maternal Data Has patient been taught Hand Expression?: Yes Does the patient have breastfeeding experience prior to this delivery?: No  Feeding Mother's Current Feeding Choice: Breast Milk and Formula Nipple Type: Nfant Slow Flow (purple)  Lactation Tools Discussed/Used Tools: Pump;Flanges Flange Size: 18 Breast pump type: Manual Pump Education: Setup, frequency, and cleaning;Milk Storage Reason for Pumping:  LPI/low birth weight feeding plan Pumping frequency: 15-20 min every 3 hrs  Interventions Interventions: Breast feeding basics reviewed;Hand pump;Education;Pace feeding;LC Services brochure;LPT handout/interventions  Discharge Discharge Education: Engorgement and breast care;Warning signs for feeding baby Pump: DEBP;Hands Free;Personal  Consult Status Consult Status: Complete Date: 03/03/23    Recardo Hoit BS, IBCLC 03/03/2023, 4:50 PM

## 2023-03-03 NOTE — Plan of Care (Signed)
 Educated and ready for discharge with no questions or concerns.

## 2023-03-07 ENCOUNTER — Telehealth (HOSPITAL_COMMUNITY): Payer: Self-pay

## 2023-03-07 NOTE — Telephone Encounter (Signed)
03/07/2023 1137  Name: Leslie Guerrero MRN: [id number] DOB: 22-May-1990  Reason for Call:  Transition of Care Hospital Discharge Call  Contact Status: Patient Contact Status: Complete  Language assistant needed:          Follow-Up Questions: Do You Have Any Concerns About Your Health As You Heal From Delivery?: No Do You Have Any Concerns About Your Infants Health?: No  Edinburgh Postnatal Depression Scale:  In the Past 7 Days: I have been able to laugh and see the funny side of things.: As much as I always could I have looked forward with enjoyment to things.: As much as I ever did I have blamed myself unnecessarily when things went wrong.: Yes, some of the time I have been anxious or worried for no good reason.: Hardly ever I have felt scared or panicky for no good reason.: No, not at all Things have been getting on top of me.: No, I have been coping as well as ever I have been so unhappy that I have had difficulty sleeping.: Not at all I have felt sad or miserable.: No, not at all I have been so unhappy that I have been crying.: No, never The thought of harming myself has occurred to me.: Never Edinburgh Postnatal Depression Scale Total: 3  PHQ2-9 Depression Scale:     Discharge Follow-up: Edinburgh score requires follow up?: No Patient was advised of the following resources:: Breastfeeding Support Group, Support Group  Post-discharge interventions: Reviewed Newborn Safe Sleep Practices  Signature  Signe Colt

## 2023-03-10 ENCOUNTER — Inpatient Hospital Stay (HOSPITAL_COMMUNITY): Payer: 59

## 2023-03-10 ENCOUNTER — Inpatient Hospital Stay (HOSPITAL_COMMUNITY): Admission: RE | Admit: 2023-03-10 | Payer: 59 | Source: Home / Self Care | Admitting: Student
